# Patient Record
Sex: Female | Born: 1950 | Race: Black or African American | Hispanic: No | Smoking: Current every day smoker
Health system: Southern US, Community
[De-identification: ages and names within clinical notes are randomized; demographics above are authoritative.]

## PROBLEM LIST (undated history)

## (undated) DIAGNOSIS — J45909 Unspecified asthma, uncomplicated: Secondary | ICD-10-CM

## (undated) DIAGNOSIS — I10 Essential (primary) hypertension: Secondary | ICD-10-CM

## (undated) DIAGNOSIS — R011 Cardiac murmur, unspecified: Secondary | ICD-10-CM

## (undated) DIAGNOSIS — J449 Chronic obstructive pulmonary disease, unspecified: Secondary | ICD-10-CM

## (undated) DIAGNOSIS — IMO0001 Reserved for inherently not codable concepts without codable children: Secondary | ICD-10-CM

## (undated) DIAGNOSIS — K219 Gastro-esophageal reflux disease without esophagitis: Secondary | ICD-10-CM

## (undated) DIAGNOSIS — J439 Emphysema, unspecified: Secondary | ICD-10-CM

## (undated) DIAGNOSIS — R51 Headache: Secondary | ICD-10-CM

## (undated) DIAGNOSIS — R519 Headache, unspecified: Secondary | ICD-10-CM

## (undated) DIAGNOSIS — M1990 Unspecified osteoarthritis, unspecified site: Secondary | ICD-10-CM

## (undated) DIAGNOSIS — F32A Depression, unspecified: Secondary | ICD-10-CM

## (undated) DIAGNOSIS — F329 Major depressive disorder, single episode, unspecified: Secondary | ICD-10-CM

## (undated) HISTORY — PX: ROTATOR CUFF REPAIR: SHX139

## (undated) HISTORY — PX: ABDOMINAL HYSTERECTOMY: SHX81

## (undated) HISTORY — PX: BACK SURGERY: SHX140

## (undated) HISTORY — PX: KNEE SURGERY: SHX244

---

## 1997-03-14 ENCOUNTER — Encounter: Payer: Self-pay | Admitting: Pulmonary Disease

## 1997-11-15 ENCOUNTER — Other Ambulatory Visit: Admission: RE | Admit: 1997-11-15 | Discharge: 1997-11-15 | Payer: Self-pay | Admitting: Obstetrics & Gynecology

## 1998-01-29 ENCOUNTER — Ambulatory Visit (HOSPITAL_COMMUNITY): Admission: RE | Admit: 1998-01-29 | Discharge: 1998-01-29 | Payer: Self-pay | Admitting: *Deleted

## 1998-11-18 ENCOUNTER — Other Ambulatory Visit: Admission: RE | Admit: 1998-11-18 | Discharge: 1998-11-18 | Payer: Self-pay | Admitting: Obstetrics & Gynecology

## 1998-12-31 ENCOUNTER — Encounter: Payer: Self-pay | Admitting: Pulmonary Disease

## 1999-04-30 ENCOUNTER — Ambulatory Visit (HOSPITAL_COMMUNITY): Admission: RE | Admit: 1999-04-30 | Discharge: 1999-04-30 | Payer: Self-pay | Admitting: Endocrinology

## 1999-04-30 ENCOUNTER — Encounter: Payer: Self-pay | Admitting: Endocrinology

## 1999-06-23 ENCOUNTER — Encounter (INDEPENDENT_AMBULATORY_CARE_PROVIDER_SITE_OTHER): Payer: Self-pay

## 1999-06-23 ENCOUNTER — Encounter: Payer: Self-pay | Admitting: *Deleted

## 1999-06-23 ENCOUNTER — Ambulatory Visit (HOSPITAL_COMMUNITY): Admission: RE | Admit: 1999-06-23 | Discharge: 1999-06-23 | Payer: Self-pay | Admitting: *Deleted

## 1999-07-08 ENCOUNTER — Emergency Department (HOSPITAL_COMMUNITY): Admission: EM | Admit: 1999-07-08 | Discharge: 1999-07-08 | Payer: Self-pay | Admitting: Emergency Medicine

## 1999-07-29 ENCOUNTER — Encounter: Payer: Self-pay | Admitting: *Deleted

## 1999-07-29 ENCOUNTER — Ambulatory Visit (HOSPITAL_COMMUNITY): Admission: RE | Admit: 1999-07-29 | Discharge: 1999-07-29 | Payer: Self-pay | Admitting: *Deleted

## 1999-09-24 ENCOUNTER — Encounter: Payer: Self-pay | Admitting: *Deleted

## 1999-09-24 ENCOUNTER — Ambulatory Visit (HOSPITAL_COMMUNITY): Admission: RE | Admit: 1999-09-24 | Discharge: 1999-09-24 | Payer: Self-pay | Admitting: *Deleted

## 1999-11-26 ENCOUNTER — Ambulatory Visit (HOSPITAL_COMMUNITY): Admission: RE | Admit: 1999-11-26 | Discharge: 1999-11-26 | Payer: Self-pay | Admitting: Endocrinology

## 1999-11-26 ENCOUNTER — Encounter: Payer: Self-pay | Admitting: Endocrinology

## 1999-12-07 ENCOUNTER — Other Ambulatory Visit: Admission: RE | Admit: 1999-12-07 | Discharge: 1999-12-07 | Payer: Self-pay | Admitting: Obstetrics & Gynecology

## 2000-02-29 ENCOUNTER — Encounter: Admission: RE | Admit: 2000-02-29 | Discharge: 2000-03-28 | Payer: Self-pay | Admitting: Endocrinology

## 2000-05-04 ENCOUNTER — Encounter: Admission: RE | Admit: 2000-05-04 | Discharge: 2000-05-04 | Payer: Self-pay | Admitting: Endocrinology

## 2000-05-04 ENCOUNTER — Encounter: Payer: Self-pay | Admitting: Endocrinology

## 2001-05-12 ENCOUNTER — Encounter (INDEPENDENT_AMBULATORY_CARE_PROVIDER_SITE_OTHER): Payer: Self-pay | Admitting: Specialist

## 2001-05-12 ENCOUNTER — Ambulatory Visit (HOSPITAL_COMMUNITY): Admission: RE | Admit: 2001-05-12 | Discharge: 2001-05-12 | Payer: Self-pay | Admitting: *Deleted

## 2001-05-29 ENCOUNTER — Other Ambulatory Visit: Admission: RE | Admit: 2001-05-29 | Discharge: 2001-05-29 | Payer: Self-pay | Admitting: Obstetrics & Gynecology

## 2002-06-18 ENCOUNTER — Other Ambulatory Visit: Admission: RE | Admit: 2002-06-18 | Discharge: 2002-06-18 | Payer: Self-pay | Admitting: Obstetrics & Gynecology

## 2002-09-24 ENCOUNTER — Encounter: Payer: Self-pay | Admitting: Orthopedic Surgery

## 2002-09-25 ENCOUNTER — Inpatient Hospital Stay (HOSPITAL_COMMUNITY): Admission: RE | Admit: 2002-09-25 | Discharge: 2002-09-27 | Payer: Self-pay | Admitting: Orthopedic Surgery

## 2002-09-25 ENCOUNTER — Encounter: Payer: Self-pay | Admitting: Orthopedic Surgery

## 2003-01-11 ENCOUNTER — Ambulatory Visit (HOSPITAL_COMMUNITY): Admission: RE | Admit: 2003-01-11 | Discharge: 2003-01-11 | Payer: Self-pay | Admitting: *Deleted

## 2003-01-11 ENCOUNTER — Encounter (INDEPENDENT_AMBULATORY_CARE_PROVIDER_SITE_OTHER): Payer: Self-pay | Admitting: Specialist

## 2003-02-15 ENCOUNTER — Other Ambulatory Visit: Admission: RE | Admit: 2003-02-15 | Discharge: 2003-02-15 | Payer: Self-pay | Admitting: Obstetrics & Gynecology

## 2003-06-24 ENCOUNTER — Other Ambulatory Visit: Admission: RE | Admit: 2003-06-24 | Discharge: 2003-06-24 | Payer: Self-pay | Admitting: Obstetrics & Gynecology

## 2003-12-02 ENCOUNTER — Other Ambulatory Visit: Admission: RE | Admit: 2003-12-02 | Discharge: 2003-12-02 | Payer: Self-pay | Admitting: Obstetrics & Gynecology

## 2004-02-24 ENCOUNTER — Encounter (INDEPENDENT_AMBULATORY_CARE_PROVIDER_SITE_OTHER): Payer: Self-pay | Admitting: Specialist

## 2004-02-24 ENCOUNTER — Ambulatory Visit (HOSPITAL_COMMUNITY): Admission: RE | Admit: 2004-02-24 | Discharge: 2004-02-24 | Payer: Self-pay | Admitting: *Deleted

## 2004-06-24 ENCOUNTER — Other Ambulatory Visit: Admission: RE | Admit: 2004-06-24 | Discharge: 2004-06-24 | Payer: Self-pay | Admitting: Obstetrics & Gynecology

## 2004-09-07 ENCOUNTER — Ambulatory Visit (HOSPITAL_COMMUNITY): Admission: RE | Admit: 2004-09-07 | Discharge: 2004-09-07 | Payer: Self-pay | Admitting: Orthopedic Surgery

## 2004-10-06 ENCOUNTER — Ambulatory Visit (HOSPITAL_COMMUNITY): Admission: RE | Admit: 2004-10-06 | Discharge: 2004-10-07 | Payer: Self-pay | Admitting: Neurosurgery

## 2005-01-04 ENCOUNTER — Ambulatory Visit (HOSPITAL_COMMUNITY): Admission: RE | Admit: 2005-01-04 | Discharge: 2005-01-04 | Payer: Self-pay | Admitting: Neurosurgery

## 2005-01-06 ENCOUNTER — Other Ambulatory Visit: Admission: RE | Admit: 2005-01-06 | Discharge: 2005-01-06 | Payer: Self-pay | Admitting: Obstetrics & Gynecology

## 2005-02-12 ENCOUNTER — Inpatient Hospital Stay (HOSPITAL_COMMUNITY): Admission: RE | Admit: 2005-02-12 | Discharge: 2005-02-14 | Payer: Self-pay | Admitting: Neurosurgery

## 2005-06-30 ENCOUNTER — Other Ambulatory Visit: Admission: RE | Admit: 2005-06-30 | Discharge: 2005-06-30 | Payer: Self-pay | Admitting: Obstetrics & Gynecology

## 2006-12-06 ENCOUNTER — Encounter: Admission: RE | Admit: 2006-12-06 | Discharge: 2006-12-06 | Payer: Self-pay | Admitting: Neurosurgery

## 2006-12-08 ENCOUNTER — Ambulatory Visit (HOSPITAL_COMMUNITY): Admission: RE | Admit: 2006-12-08 | Discharge: 2006-12-08 | Payer: Self-pay | Admitting: *Deleted

## 2007-05-02 ENCOUNTER — Inpatient Hospital Stay (HOSPITAL_COMMUNITY): Admission: RE | Admit: 2007-05-02 | Discharge: 2007-05-05 | Payer: Self-pay | Admitting: Neurosurgery

## 2007-08-10 ENCOUNTER — Encounter: Admission: RE | Admit: 2007-08-10 | Discharge: 2007-08-10 | Payer: Self-pay | Admitting: Orthopedic Surgery

## 2007-10-20 ENCOUNTER — Ambulatory Visit (HOSPITAL_COMMUNITY): Admission: RE | Admit: 2007-10-20 | Discharge: 2007-10-20 | Payer: Self-pay | Admitting: Endocrinology

## 2008-12-24 ENCOUNTER — Inpatient Hospital Stay (HOSPITAL_COMMUNITY): Admission: EM | Admit: 2008-12-24 | Discharge: 2008-12-26 | Payer: Self-pay | Admitting: Interventional Radiology

## 2009-01-29 ENCOUNTER — Encounter: Admission: RE | Admit: 2009-01-29 | Discharge: 2009-01-29 | Payer: Self-pay | Admitting: Endocrinology

## 2009-02-20 ENCOUNTER — Inpatient Hospital Stay (HOSPITAL_COMMUNITY): Admission: EM | Admit: 2009-02-20 | Discharge: 2009-02-27 | Payer: Self-pay | Admitting: Emergency Medicine

## 2009-02-20 ENCOUNTER — Ambulatory Visit: Payer: Self-pay | Admitting: Internal Medicine

## 2009-03-04 ENCOUNTER — Encounter: Payer: Self-pay | Admitting: Pulmonary Disease

## 2009-03-05 ENCOUNTER — Ambulatory Visit: Payer: Self-pay | Admitting: Pulmonary Disease

## 2009-03-05 DIAGNOSIS — J45909 Unspecified asthma, uncomplicated: Secondary | ICD-10-CM | POA: Insufficient documentation

## 2009-03-05 DIAGNOSIS — I4891 Unspecified atrial fibrillation: Secondary | ICD-10-CM | POA: Insufficient documentation

## 2009-03-05 DIAGNOSIS — I10 Essential (primary) hypertension: Secondary | ICD-10-CM | POA: Insufficient documentation

## 2009-03-05 DIAGNOSIS — J4489 Other specified chronic obstructive pulmonary disease: Secondary | ICD-10-CM | POA: Insufficient documentation

## 2009-03-05 DIAGNOSIS — J449 Chronic obstructive pulmonary disease, unspecified: Secondary | ICD-10-CM

## 2009-03-05 DIAGNOSIS — R930 Abnormal findings on diagnostic imaging of skull and head, not elsewhere classified: Secondary | ICD-10-CM | POA: Insufficient documentation

## 2009-03-05 DIAGNOSIS — J438 Other emphysema: Secondary | ICD-10-CM | POA: Insufficient documentation

## 2009-03-18 ENCOUNTER — Ambulatory Visit: Payer: Self-pay | Admitting: Pulmonary Disease

## 2009-06-09 ENCOUNTER — Ambulatory Visit: Payer: Self-pay | Admitting: Internal Medicine

## 2009-07-11 ENCOUNTER — Encounter: Admission: RE | Admit: 2009-07-11 | Discharge: 2009-07-11 | Payer: Self-pay | Admitting: Endocrinology

## 2009-08-08 ENCOUNTER — Encounter: Admission: RE | Admit: 2009-08-08 | Discharge: 2009-08-08 | Payer: Self-pay | Admitting: Obstetrics & Gynecology

## 2010-03-31 ENCOUNTER — Encounter: Admission: RE | Admit: 2010-03-31 | Discharge: 2010-03-31 | Payer: Self-pay | Admitting: Neurosurgery

## 2010-08-27 ENCOUNTER — Encounter
Admission: RE | Admit: 2010-08-27 | Discharge: 2010-08-27 | Payer: Self-pay | Source: Home / Self Care | Attending: Obstetrics & Gynecology | Admitting: Obstetrics & Gynecology

## 2010-09-24 IMAGING — CR DG CHEST 1V PORT
1 series · 1 of 1 positions shown · non-contrast
Comparison: Chest 02/21/2009 at 7055 hours.

CLINICAL DATA: PICC placement.

PORTABLE CHEST - 1 VIEW

[view not recorded]
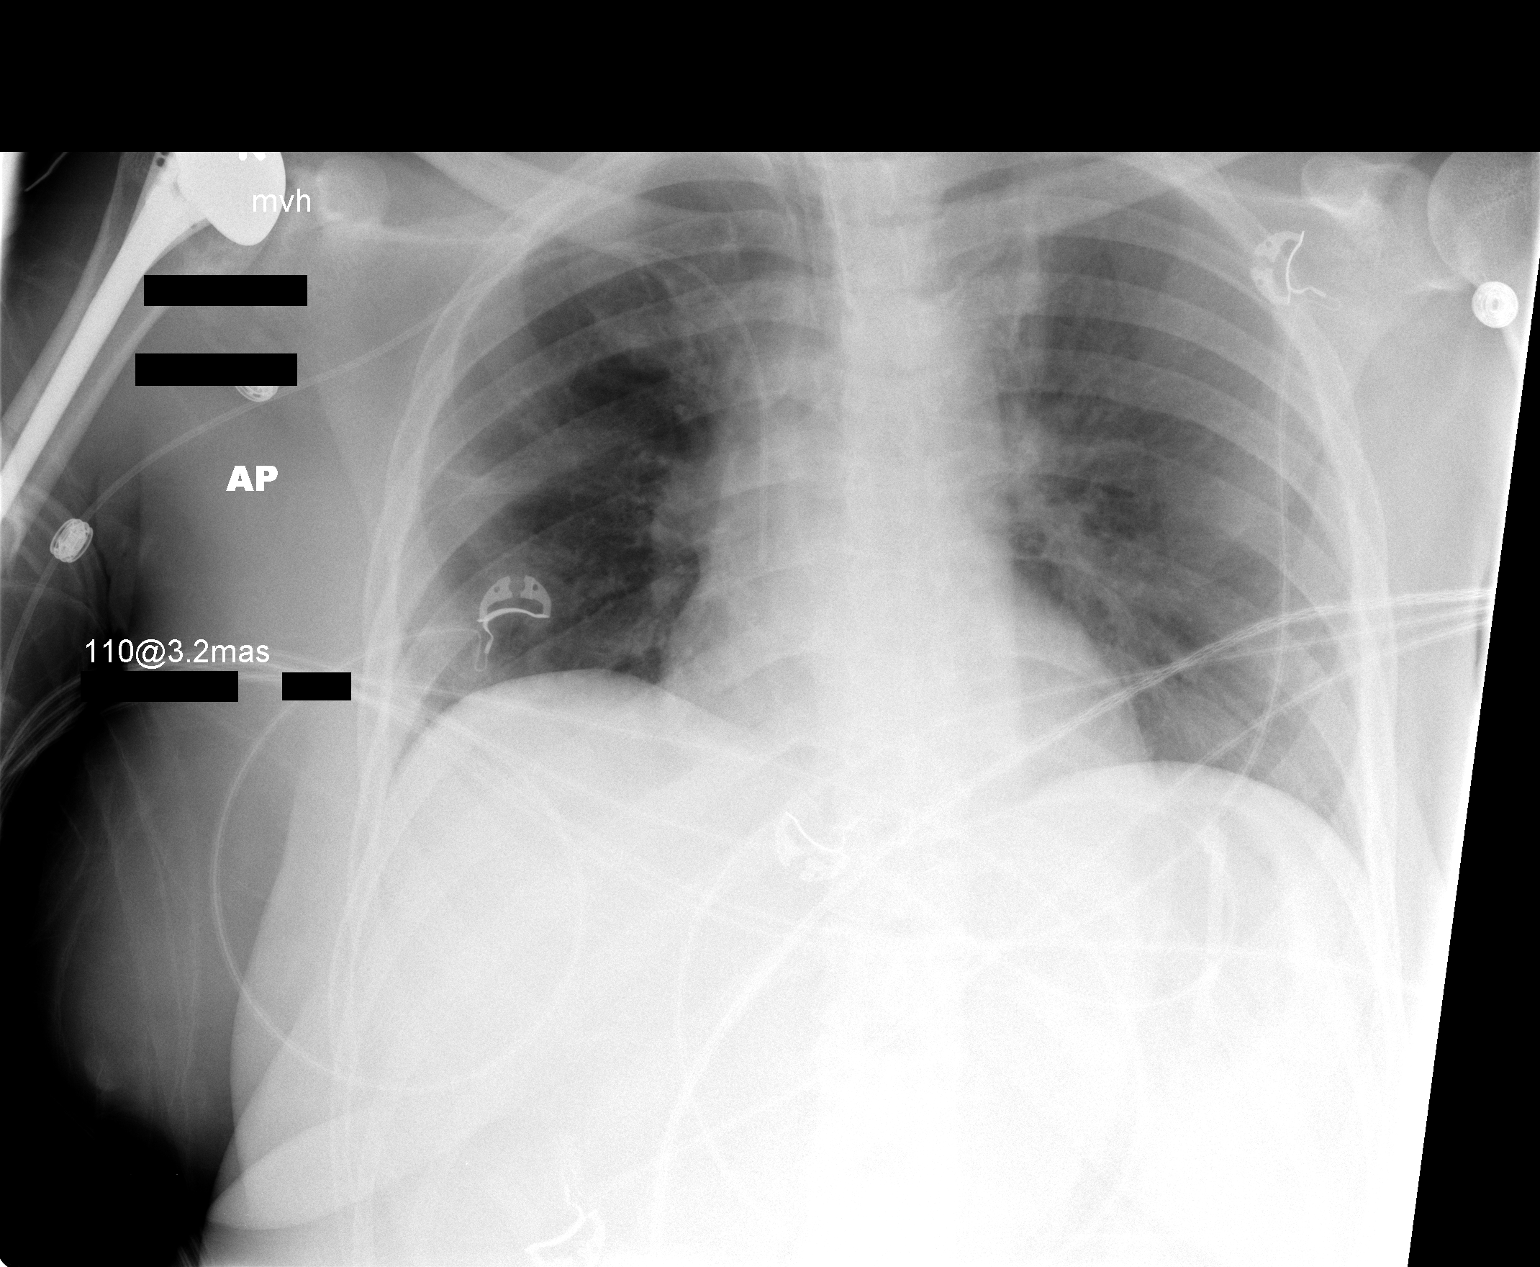

[1 of 1 positions shown; findings below may reference images not displayed]

FINDINGS: Right PICC is in place the tip in the lower superior vena
cava.  Endotracheal tube again noted. No pneumothorax.  Lungs are
clear.  Heart size normal.
IMPRESSION: Right PICC in good position.  No pneumothorax or other change.

## 2010-09-25 IMAGING — CR DG CHEST 1V PORT
1 series · 1 of 1 positions shown · non-contrast
Comparison: 02/21/2009.

CLINICAL DATA: Respiratory distress.

PORTABLE CHEST - 1 VIEW

[view not recorded]
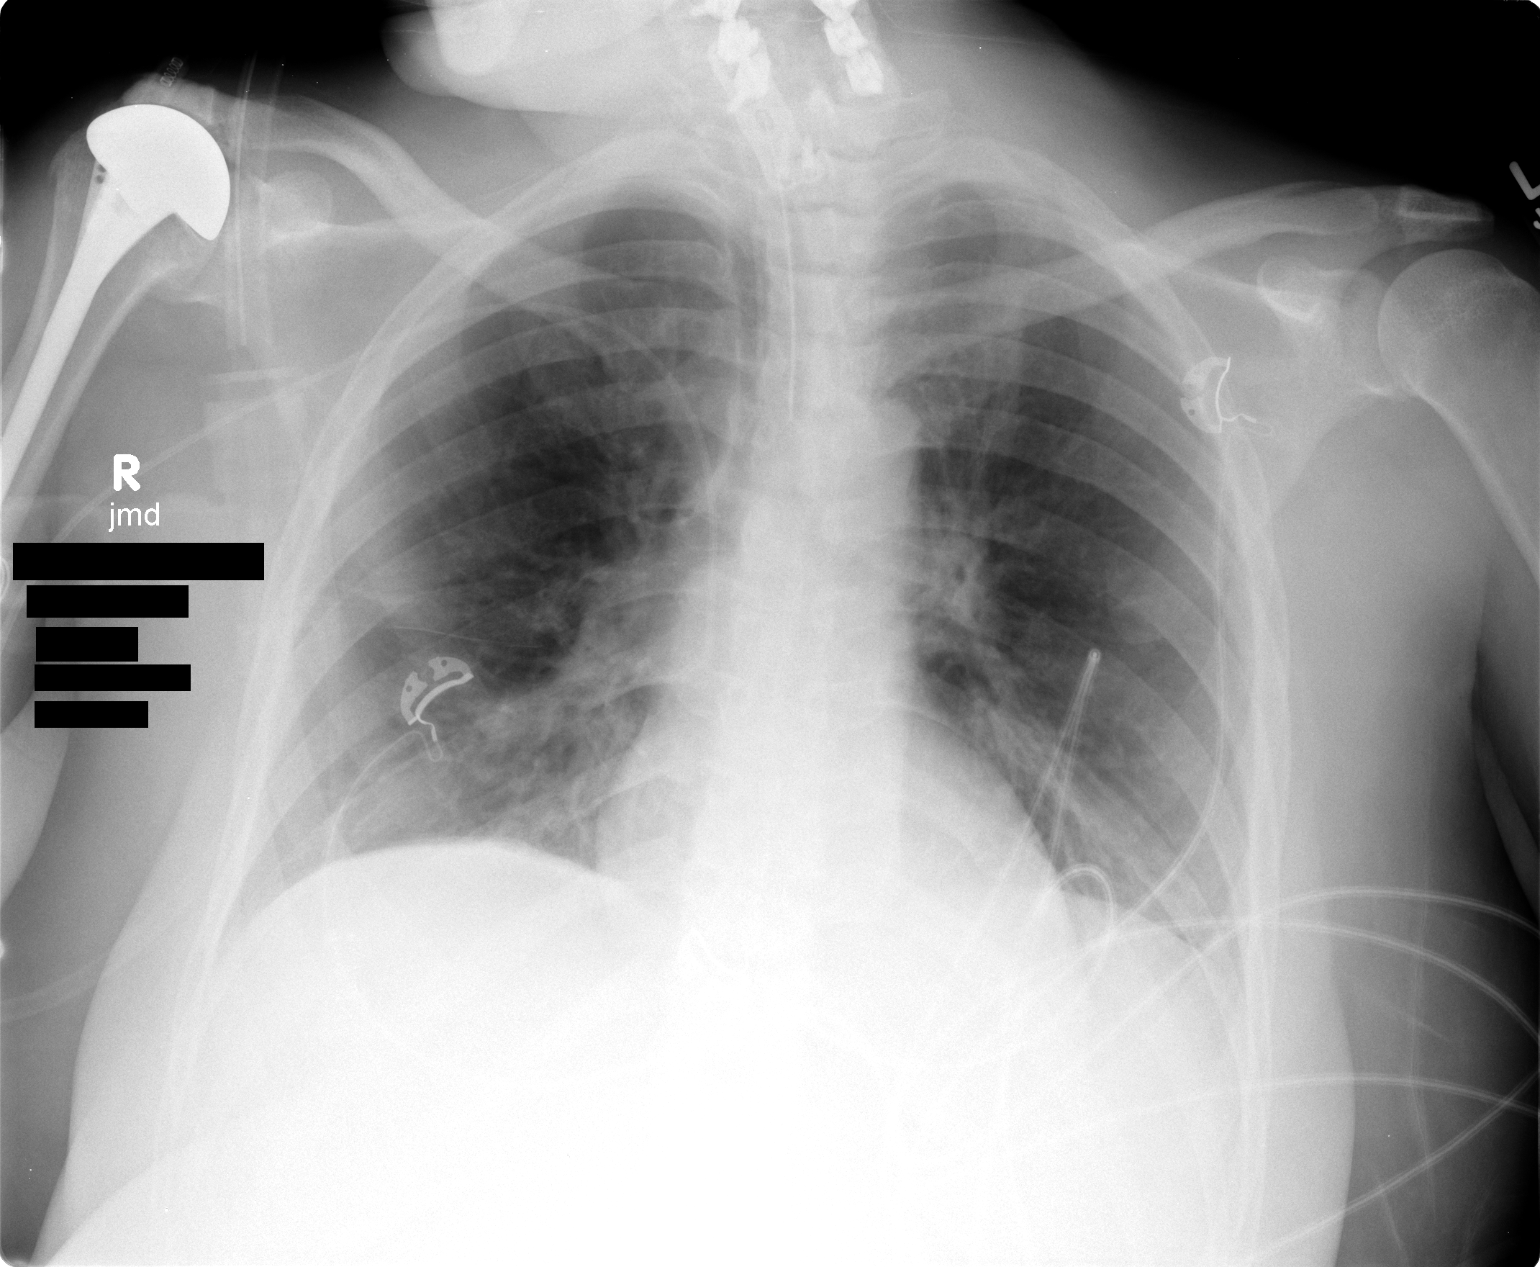

[1 of 1 positions shown; findings below may reference images not displayed]

FINDINGS: Endotracheal tube is in good position.  PICC line tip is
in the SVC and unchanged.  Increase in bibasilar airspace disease
which is likely atelectasis.  There is no edema or effusion.
IMPRESSION: Mild increase in bibasilar atelectasis.

## 2010-09-26 IMAGING — CR DG ABD PORTABLE 1V
1 series · 1 of 1 positions shown · non-contrast
Comparison: None.

CLINICAL DATA: 57-year-old female nasogastric tube placement.

ABDOMEN - 1 VIEW

[view not recorded]
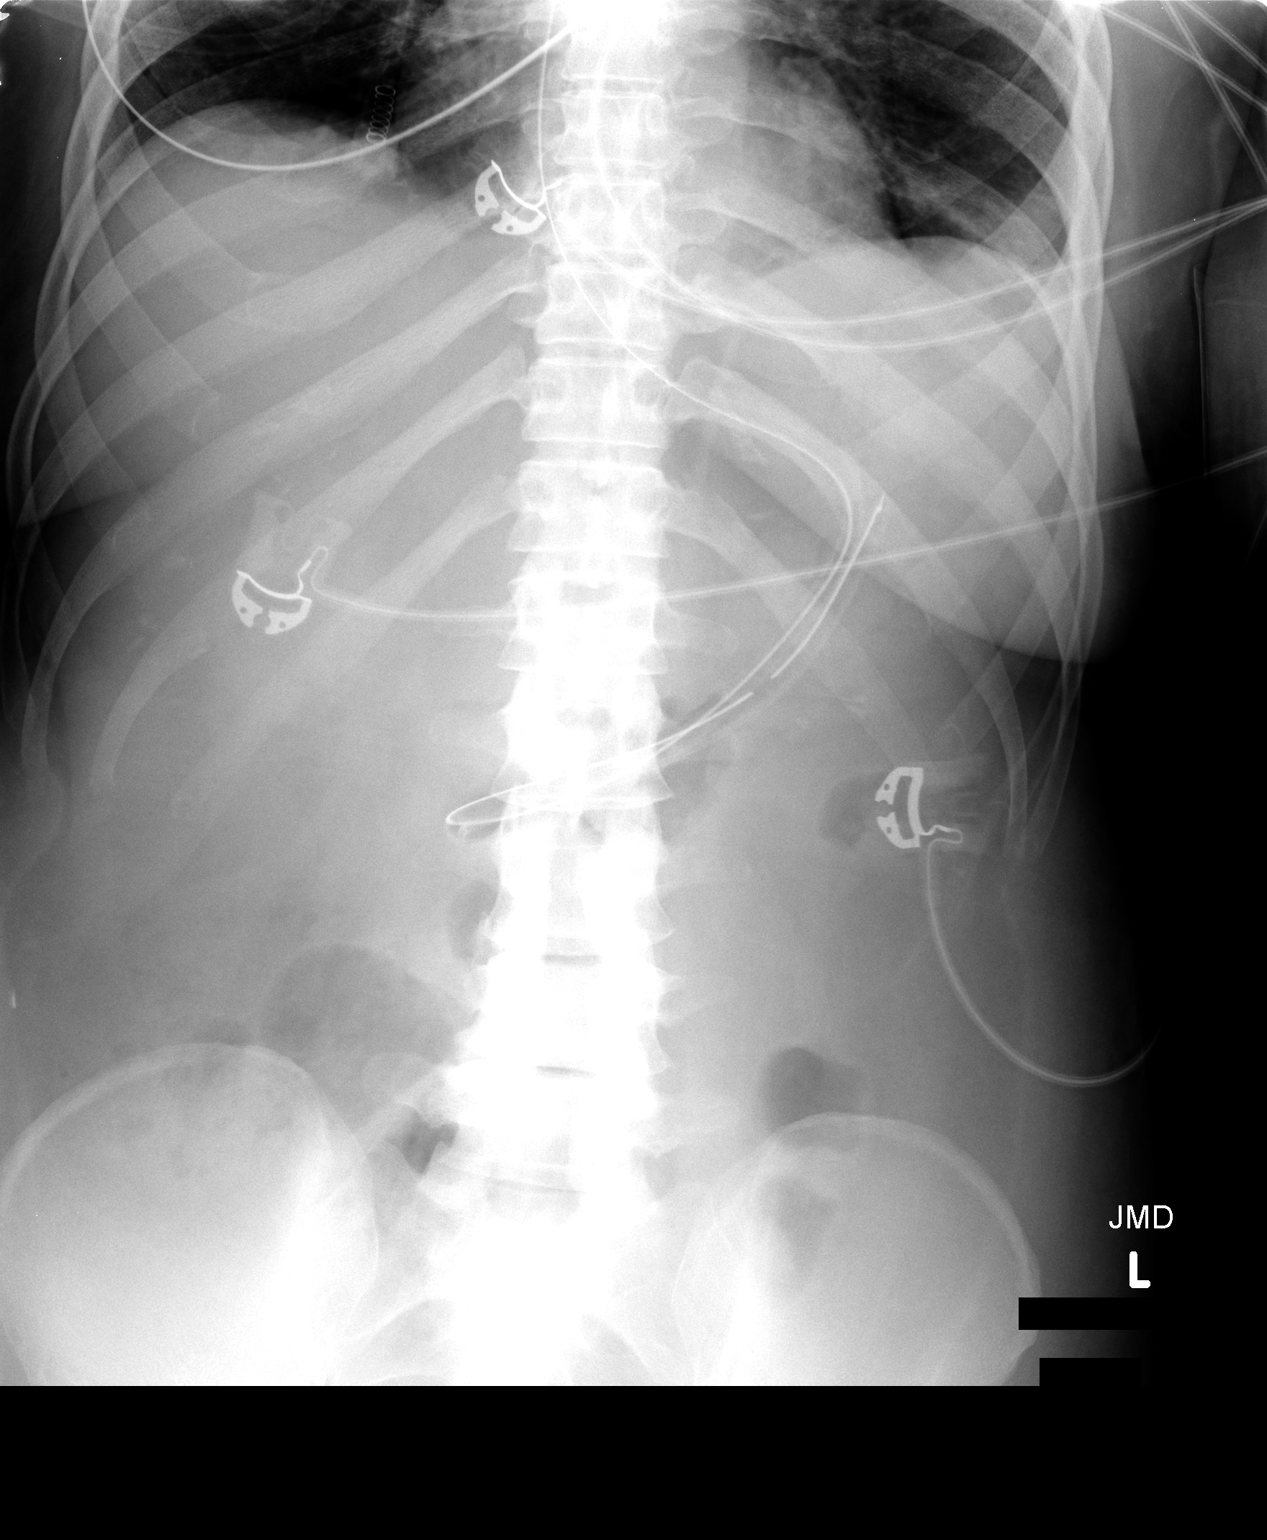

[1 of 1 positions shown; findings below may reference images not displayed]

FINDINGS: Portable AP view 6626 hours.  Nasogastric tube is looped
in the gastric body, tip at the level of fundus.  Paucity of bowel
gas.  Mild basilar atelectasis at the lung bases.
IMPRESSION: Nasogastric type tube looped in the stomach, tip at the level of
the fundus.

## 2010-11-08 LAB — BLOOD GAS, ARTERIAL
Acid-Base Excess: 1.2 mmol/L (ref 0.0–2.0)
Acid-base deficit: 10 mmol/L — ABNORMAL HIGH (ref 0.0–2.0)
Acid-base deficit: 12 mmol/L — ABNORMAL HIGH (ref 0.0–2.0)
Acid-base deficit: 12.8 mmol/L — ABNORMAL HIGH (ref 0.0–2.0)
Bicarbonate: 19.9 mEq/L — ABNORMAL LOW (ref 20.0–24.0)
Bicarbonate: 22.6 mEq/L (ref 20.0–24.0)
Bicarbonate: 25.9 mEq/L — ABNORMAL HIGH (ref 20.0–24.0)
Bicarbonate: 30.4 mEq/L — ABNORMAL HIGH (ref 20.0–24.0)
Drawn by: 232811
Drawn by: 280971
Drawn by: 312761
FIO2: 0.3 %
FIO2: 0.3 %
FIO2: 0.3 %
FIO2: 0.5 %
FIO2: 0.5 %
MECHVT: 0.65 mL
MECHVT: 500 mL
MECHVT: 550 mL
MECHVT: 650 mL
MECHVT: 650 mL
MECHVT: 650 mL
Mode: 650
O2 Saturation: 95.5 %
O2 Saturation: 96.8 %
O2 Saturation: 98 %
O2 Saturation: 98.9 %
PEEP: 5 cmH2O
PEEP: 5 cmH2O
PEEP: 5 cmH2O
PEEP: 5 cmH2O
PEEP: 5 cmH2O
Patient temperature: 97.3
Patient temperature: 98.6
Patient temperature: 98.6
Patient temperature: 98.6
Patient temperature: 98.6
Patient temperature: 98.6
Patient temperature: 98.6
Patient temperature: 98.6
RATE: 10 resp/min
RATE: 10 resp/min
RATE: 10 resp/min
RATE: 12 resp/min
RATE: 8 resp/min
TCO2: 16.7 mmol/L (ref 0–100)
TCO2: 18.5 mmol/L (ref 0–100)
TCO2: 21.4 mmol/L (ref 0–100)
TCO2: 23.6 mmol/L (ref 0–100)
pCO2 arterial: 47.1 mmHg — ABNORMAL HIGH (ref 35.0–45.0)
pCO2 arterial: 53.9 mmHg — ABNORMAL HIGH (ref 35.0–45.0)
pCO2 arterial: 57.7 mmHg (ref 35.0–45.0)
pH, Arterial: 7.187 — CL (ref 7.350–7.400)
pH, Arterial: 7.311 — ABNORMAL LOW (ref 7.350–7.400)
pH, Arterial: 7.388 (ref 7.350–7.400)
pO2, Arterial: 101 mmHg — ABNORMAL HIGH (ref 80.0–100.0)
pO2, Arterial: 119 mmHg — ABNORMAL HIGH (ref 80.0–100.0)

## 2010-11-08 LAB — CROSSMATCH: ABO/RH(D): A POS

## 2010-11-08 LAB — CBC
HCT: 35.3 % — ABNORMAL LOW (ref 36.0–46.0)
HCT: 35.7 % — ABNORMAL LOW (ref 36.0–46.0)
HCT: 37.3 % (ref 36.0–46.0)
HCT: 40.5 % (ref 36.0–46.0)
Hemoglobin: 11.7 g/dL — ABNORMAL LOW (ref 12.0–15.0)
Hemoglobin: 13.7 g/dL (ref 12.0–15.0)
Hemoglobin: 8.6 g/dL — ABNORMAL LOW (ref 12.0–15.0)
MCHC: 32.4 g/dL (ref 30.0–36.0)
MCHC: 32.8 g/dL (ref 30.0–36.0)
MCHC: 33.7 g/dL (ref 30.0–36.0)
MCHC: 33.8 g/dL (ref 30.0–36.0)
MCV: 93.3 fL (ref 78.0–100.0)
MCV: 94.1 fL (ref 78.0–100.0)
MCV: 95 fL (ref 78.0–100.0)
MCV: 95.7 fL (ref 78.0–100.0)
Platelets: 305 10*3/uL (ref 150–400)
RBC: 2.72 MIL/uL — ABNORMAL LOW (ref 3.87–5.11)
RBC: 3.76 MIL/uL — ABNORMAL LOW (ref 3.87–5.11)
RBC: 3.79 MIL/uL — ABNORMAL LOW (ref 3.87–5.11)
RBC: 4 MIL/uL (ref 3.87–5.11)
RBC: 4.38 MIL/uL (ref 3.87–5.11)
RDW: 13.4 % (ref 11.5–15.5)
RDW: 13.7 % (ref 11.5–15.5)
RDW: 13.9 % (ref 11.5–15.5)
RDW: 13.9 % (ref 11.5–15.5)
WBC: 10.2 10*3/uL (ref 4.0–10.5)
WBC: 10.5 10*3/uL (ref 4.0–10.5)
WBC: 8.9 10*3/uL (ref 4.0–10.5)
WBC: 9.8 10*3/uL (ref 4.0–10.5)

## 2010-11-08 LAB — RAPID URINE DRUG SCREEN, HOSP PERFORMED
Amphetamines: NOT DETECTED
Barbiturates: NOT DETECTED
Benzodiazepines: POSITIVE — AB

## 2010-11-08 LAB — BASIC METABOLIC PANEL
BUN: 12 mg/dL (ref 6–23)
BUN: 13 mg/dL (ref 6–23)
CO2: 17 mEq/L — ABNORMAL LOW (ref 19–32)
CO2: 17 mEq/L — ABNORMAL LOW (ref 19–32)
CO2: 26 mEq/L (ref 19–32)
CO2: 29 mEq/L (ref 19–32)
Calcium: 7 mg/dL — ABNORMAL LOW (ref 8.4–10.5)
Calcium: 7.5 mg/dL — ABNORMAL LOW (ref 8.4–10.5)
Calcium: 8.1 mg/dL — ABNORMAL LOW (ref 8.4–10.5)
Calcium: 8.8 mg/dL (ref 8.4–10.5)
Chloride: 102 mEq/L (ref 96–112)
Chloride: 105 mEq/L (ref 96–112)
Chloride: 108 mEq/L (ref 96–112)
Chloride: 117 mEq/L — ABNORMAL HIGH (ref 96–112)
Creatinine, Ser: 0.78 mg/dL (ref 0.4–1.2)
Creatinine, Ser: 0.79 mg/dL (ref 0.4–1.2)
Creatinine, Ser: 1.36 mg/dL — ABNORMAL HIGH (ref 0.4–1.2)
GFR calc Af Amer: 40 mL/min — ABNORMAL LOW (ref >60)
GFR calc Af Amer: 49 mL/min — ABNORMAL LOW (ref >60)
GFR calc Af Amer: >60 mL/min (ref >60)
GFR calc Af Amer: >60 mL/min (ref >60)
GFR calc Af Amer: >60 mL/min (ref >60)
GFR calc Af Amer: >60 mL/min (ref >60)
GFR calc Af Amer: >60 mL/min (ref >60)
GFR calc non Af Amer: 51 mL/min — ABNORMAL LOW (ref >60)
GFR calc non Af Amer: >60 mL/min (ref >60)
Glucose, Bld: 120 mg/dL — ABNORMAL HIGH (ref 70–99)
Glucose, Bld: 145 mg/dL — ABNORMAL HIGH (ref 70–99)
Glucose, Bld: 217 mg/dL — ABNORMAL HIGH (ref 70–99)
Potassium: 3 mEq/L — ABNORMAL LOW (ref 3.5–5.1)
Potassium: 3.2 mEq/L — ABNORMAL LOW (ref 3.5–5.1)
Potassium: 3.4 mEq/L — ABNORMAL LOW (ref 3.5–5.1)
Potassium: 3.8 mEq/L (ref 3.5–5.1)
Sodium: 135 mEq/L (ref 135–145)
Sodium: 139 mEq/L (ref 135–145)
Sodium: 140 mEq/L (ref 135–145)

## 2010-11-08 LAB — BRAIN NATRIURETIC PEPTIDE: Pro B Natriuretic peptide (BNP): 93.7 pg/mL (ref 0.0–100.0)

## 2010-11-08 LAB — PROTIME-INR
INR: 1.1 (ref 0.00–1.49)
Prothrombin Time: 14.6 seconds (ref 11.6–15.2)

## 2010-11-08 LAB — GLUCOSE, CAPILLARY
Glucose-Capillary: 115 mg/dL — ABNORMAL HIGH (ref 70–99)
Glucose-Capillary: 140 mg/dL — ABNORMAL HIGH (ref 70–99)
Glucose-Capillary: 143 mg/dL — ABNORMAL HIGH (ref 70–99)
Glucose-Capillary: 144 mg/dL — ABNORMAL HIGH (ref 70–99)
Glucose-Capillary: 146 mg/dL — ABNORMAL HIGH (ref 70–99)
Glucose-Capillary: 148 mg/dL — ABNORMAL HIGH (ref 70–99)
Glucose-Capillary: 148 mg/dL — ABNORMAL HIGH (ref 70–99)
Glucose-Capillary: 151 mg/dL — ABNORMAL HIGH (ref 70–99)
Glucose-Capillary: 156 mg/dL — ABNORMAL HIGH (ref 70–99)
Glucose-Capillary: 169 mg/dL — ABNORMAL HIGH (ref 70–99)
Glucose-Capillary: 200 mg/dL — ABNORMAL HIGH (ref 70–99)
Glucose-Capillary: 75 mg/dL (ref 70–99)
Glucose-Capillary: 97 mg/dL (ref 70–99)

## 2010-11-08 LAB — CULTURE, BLOOD (ROUTINE X 2)
Culture: NO GROWTH
Culture: NO GROWTH

## 2010-11-08 LAB — URINALYSIS, ROUTINE W REFLEX MICROSCOPIC
Bilirubin Urine: NEGATIVE
Glucose, UA: NEGATIVE mg/dL
Protein, ur: 100 mg/dL — AB
Urobilinogen, UA: 0.2 mg/dL (ref 0.0–1.0)

## 2010-11-08 LAB — URINE MICROSCOPIC-ADD ON

## 2010-11-08 LAB — CULTURE, RESPIRATORY W GRAM STAIN

## 2010-11-08 LAB — CARDIAC PANEL(CRET KIN+CKTOT+MB+TROPI)
CK, MB: 3.6 ng/mL (ref 0.3–4.0)
CK, MB: 5.2 ng/mL — ABNORMAL HIGH (ref 0.3–4.0)
Relative Index: 2.1 (ref 0.0–2.5)
Troponin I: 0.2 ng/mL — ABNORMAL HIGH (ref 0.00–0.06)

## 2010-11-08 LAB — LACTIC ACID, PLASMA
Lactic Acid, Venous: 1.7 mmol/L (ref 0.5–2.2)
Lactic Acid, Venous: 4.4 mmol/L — ABNORMAL HIGH (ref 0.5–2.2)

## 2010-11-08 LAB — HEMOGLOBIN: Hemoglobin: 7.6 g/dL — CL (ref 12.0–15.0)

## 2010-11-08 LAB — URINE CULTURE
Colony Count: 50000
Special Requests: NEGATIVE

## 2010-11-08 LAB — MAGNESIUM: Magnesium: 2.1 mg/dL (ref 1.5–2.5)

## 2010-11-08 LAB — LEGIONELLA ANTIGEN, URINE

## 2010-11-10 LAB — BASIC METABOLIC PANEL
BUN: 39 mg/dL — ABNORMAL HIGH (ref 6–23)
CO2: 24 mEq/L (ref 19–32)
Calcium: 8.8 mg/dL (ref 8.4–10.5)
Creatinine, Ser: 1.32 mg/dL — ABNORMAL HIGH (ref 0.4–1.2)
GFR calc non Af Amer: 42 mL/min — ABNORMAL LOW (ref >60)
GFR calc non Af Amer: 45 mL/min — ABNORMAL LOW (ref >60)
GFR calc non Af Amer: 53 mL/min — ABNORMAL LOW (ref >60)
Glucose, Bld: 100 mg/dL — ABNORMAL HIGH (ref 70–99)
Potassium: 4.4 mEq/L (ref 3.5–5.1)
Potassium: 4.9 mEq/L (ref 3.5–5.1)
Sodium: 132 mEq/L — ABNORMAL LOW (ref 135–145)
Sodium: 133 mEq/L — ABNORMAL LOW (ref 135–145)
Sodium: 134 mEq/L — ABNORMAL LOW (ref 135–145)

## 2010-11-10 LAB — DIFFERENTIAL
Basophils Absolute: 0.1 10*3/uL (ref 0.0–0.1)
Basophils Relative: 0 % (ref 0–1)
Lymphocytes Relative: 19 % (ref 12–46)
Monocytes Absolute: 1 10*3/uL (ref 0.1–1.0)
Neutro Abs: 11.6 10*3/uL — ABNORMAL HIGH (ref 1.7–7.7)
Neutrophils Relative %: 74 % (ref 43–77)

## 2010-11-10 LAB — CBC
HCT: 27.9 % — ABNORMAL LOW (ref 36.0–46.0)
HCT: 30.3 % — ABNORMAL LOW (ref 36.0–46.0)
Hemoglobin: 10 g/dL — ABNORMAL LOW (ref 12.0–15.0)
Hemoglobin: 11.9 g/dL — ABNORMAL LOW (ref 12.0–15.0)
Hemoglobin: 9.6 g/dL — ABNORMAL LOW (ref 12.0–15.0)
MCHC: 33.1 g/dL (ref 30.0–36.0)
Platelets: 231 10*3/uL (ref 150–400)
Platelets: 307 10*3/uL (ref 150–400)
RDW: 17 % — ABNORMAL HIGH (ref 11.5–15.5)
WBC: 16.3 10*3/uL — ABNORMAL HIGH (ref 4.0–10.5)
WBC: 6.4 10*3/uL (ref 4.0–10.5)

## 2010-11-10 LAB — TROPONIN I: Troponin I: 0.16 ng/mL — ABNORMAL HIGH (ref 0.00–0.06)

## 2010-11-10 LAB — CULTURE, BLOOD (ROUTINE X 2)
Culture: NO GROWTH
Culture: NO GROWTH

## 2010-11-10 LAB — CARDIAC PANEL(CRET KIN+CKTOT+MB+TROPI)
CK, MB: 1.4 ng/mL (ref 0.3–4.0)
CK, MB: 2.1 ng/mL (ref 0.3–4.0)
Total CK: 30 U/L (ref 7–177)
Total CK: 43 U/L (ref 7–177)

## 2010-11-10 LAB — BRAIN NATRIURETIC PEPTIDE: Pro B Natriuretic peptide (BNP): <30 pg/mL (ref 0.0–100.0)

## 2010-12-15 NOTE — Op Note (Signed)
Colleen [name], Colleen [name]                ACCOUNT NO.:  [id number]   MEDICAL RECORD NO.:  [id number]          PATIENT TYPE:  INP   LOCATION:  3172                         FACILITY:  [facility]   PHYSICIAN:  Hilda Lias, M.D.   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   PROCEDURE:  Bilateral L3, L4, and L5 laminectomy, foraminotomy,  posterolateral arthrodesis with autograft.   SURGEON:  Hilda Lias, M.D.   ASSISTANT:  Payton Doughty, M.D.   CLINICAL HISTORY:  The patient was admitted because of back pain with  radiation to both legs, especially with walking.  X-rays showed severe  stenosis at the level of L3-4, L4-5, and L5-S1.  Surgery was advised.   PROCEDURE:  The patient was taken to the O.R.,and she was positioned in  a prone manner.  The skin was cleaned with DuraPrep.  Drapes were  applied.  Midline incision from L3 down to L5-S1 was made, and muscle  was retracted laterally until we were able to see the lateral aspect of  the facet.  Then, with the Leksell, we removed the spinous processes of  L3, L4, and L5.  This bone was really quite strong.  With the drill, we  did a bilateral laminectomy.  A thick yellow ligament, mostly at the  level of L3-4 and L4-5, was removed.  From then on, having a good  laminotomy and a good decompression, we went laterally, and we were able  to do laminotomy to decompress the L3, L4, L5, and S1 nerve roots.  Then, with the drill, we removed the periosteum of the lateral aspect of  facet as well as the transverse processes of L3, L4, and L5, and the ala  of the sacrum.  The bone that came from the spine was cleaned, and it  was used laterally for posterolateral arthrodesis.  The wound was  irrigated.  Fentanyl was left. Valsalva maneuver was negative.  The skin  was closed with Vicryl and Steri-Strips.           ______________________________  Hilda Lias, M.D.     EB/MEDQ  D:  05/02/2007  T:  05/02/2007  Job:  [id number]

## 2010-12-15 NOTE — Discharge Summary (Signed)
[name], Colleen [name]                ACCOUNT NO.:  [id number]   MEDICAL RECORD NO.:  [id number]          PATIENT TYPE:  INP   LOCATION:  3022                         FACILITY:  [facility]   PHYSICIAN:  Hilda Lias, M.D.   DATE OF BIRTH:  07/22/1951   DATE OF ADMISSION:  05/02/2007  DATE OF DISCHARGE:  05/05/2007                               DISCHARGE SUMMARY   ADMISSION DIAGNOSIS:  Lumbar stenosis L3 to L5-S1.   FINAL DIAGNOSIS:  Lumbar stenosis L3 to L5-S1.   CLINICAL HISTORY:  The patient was admitted because of back pain  radiating to both legs.  X-ray showed severe stenosis from 3-4, 4-5.  Surgery was advised.   LABORATORY DATA:  Normal.   COURSE IN THE HOSPITAL:  The patient was taken to surgery, and a  laminectomy and posterolateral arthrodesis with autograft were done.  Today she is feeling much better.  She is ambulating.  She had minimal  pain.  She is being discharged to be followed by me in my office.   CONDITION ON DISCHARGE:  Improvement.   MEDICATIONS:  Percocet, diazepam.  She is to continue her previous  medications.   DIET:  Regular.   ACTIVITY:  Not to drive for at least two weeks, not to bend.   FOLLOWUP:  She will be seen by me in six weeks.           ______________________________  Hilda Lias, M.D.     EB/MEDQ  D:  05/05/2007  T:  05/06/2007  Job:  [id number]

## 2010-12-15 NOTE — H&P (Signed)
[name], [name] NO.:  [id number]   MEDICAL RECORD NO.:  [id number]          PATIENT TYPE:  EMS   LOCATION:  ED                           FACILITY:  [facility]   PHYSICIAN:  Zannie Cove, MD     DATE OF BIRTH:  May 24, 1951   DATE OF ADMISSION:  12/24/2008  DATE OF DISCHARGE:                              HISTORY & PHYSICAL   CHIEF COMPLAINT:  Shortness of breath and wheezing.   HISTORY OF PRESENTING ILLNESS:  Colleen [name] is a 60 year old African  American female, current smoker, who presents with complaints of  shortness of breath and cough for the past 2-3 days.  She has had  worsening shortness of breath associated with wheezing and nonproductive  cough that did not improve with albuterol treatments at home.  It  worsened to the extent of feeling severe shortness of breath and chest  tightness at rest as well.  Patient reports feeling feverish and also  reports chills, denies chest pain.  However, she has chest tightness.  She denies any sick contacts except sitting next to a sick child about 2  weeks ago, history of rhinorrhea present that seems to be chronic, no  weight loss.  Patient is a current smoker, smokes 1 pack per day.  In  addition, she also uses oxygen as needed.   PAST MEDICAL HISTORY:  1. Significant for asthma/COPD.  2. Hypertension.  3. Spinal stenosis.  4. Allergies.  5. Cluster headaches.  6. Heart murmur.   PAST SURGICAL HISTORY:  Hysterectomy, bilateral knee surgery,  cervical/lower back spine surgery.   ALLERGIES:  CODEINE AND LITHIUM.   MEDICATIONS:  1. Symbicort 160/4.5 two puffs b.i.d.  2. Propranolol 20 mg b.i.d.  3. Divalproex 500 at bedtime.  4. Nabumetone 500 p.r.n.  5. Triamterene/hydrochlorothiazide 37.5/25 daily.  6. Hydrocodone/APAP 7.5/750 q.i.d.  7. Migranal 4 mg p.o. p.r.n.  8. __________ 5 mg daily.  9. Crestor 20 mg daily.  10.Synthroid 50 mcg daily.  11.Effexor 75 mg daily.  12.Hyoscyamine 0.125 mg p.r.n.  13.Protonix 40 mg daily.  14.Lumigan 0.03% in both eyes at bedtime.  15.Oxygen as needed.  16.Benicar 20 mg daily.  17.Albuterol inhalation p.r.n.  18.EpiPen subcutaneously p.r.n.  19.Metrocream 0.75% b.i.d.   SOCIAL HISTORY:  Lives at home by herself, former heavy smoker, smoked 2-  3 packs per day, currently smokes 1 pack per day and has been an active  smoker for 40 years.  History of alcohol also, drinks 2 glasses of wine  per night, denies drug abuse.   FAMILY HISTORY:  Sister is deceased, had asthma.   REVIEW OF SYSTEMS:  Twelve-system review negative except for HPI.   PHYSICAL EXAMINATION:  VITAL SIGNS:  Temperature 98.7, pulse 120, blood  pressure 135/71, respirations 24, saturating 99% on 4 L.  GENERAL:  Alert, awake, oriented x3, moderate distress, not using  accessory muscles.  HEENT/NECK:  No JVD.  CARDIOVASCULAR:  S1, S2, regular rate and rhythm.  LUNGS:  Moderate air entry bilaterally and bilateral inspiratory and  expiratory wheezes.  ABDOMEN:  Soft, nontender, positive bowel sounds.  EXTREMITIES:  No edema, clubbing or cyanosis.   LABORATORIES:  White count of 15.6, hemoglobin 11.9, platelets 307,  sodium 132, potassium 4.1, chloride 99, bicarb 24, BUN 51, creatinine  1.3, glucose 100, BNP less than 30, D-dimer less than 0.22.  Chest x-ray  with right basilar atelectasis, no acute cardiopulmonary disease.  EKG  with sinus tachycardia, rate 119, no acute ST-T changes.   ASSESSMENT/PLAN:  1. This is a 60 year old female with chronic obstructive pulmonary      disease exacerbation.  We will admit to the floor and treat with      supplemental oxygen to keep saturations greater than 90%.  In      addition, give Xopenex and Atrovent nebulizers q.4 h.and p.r.n. and      IV Solu-Medrol 80 mg IV q.8 h.  Will also check two sets of cardiac      enzymes and blood and sputum cultures.  In addition, patient to      receive pneumonia vaccine on discharge and smoking  cessation      counseling as well.  2. We will hold the propranolol which she seems to be on for      hypertension.  3. Renal insufficiency likely secondary to dehydration.  Hold Benicar      and hydrochlorothiazide for now.  Hydrate with normal saline on 75      mL an hour.  4. Hypertension.  Monitor off propranolol, hydrochlorothiazide and      Benicar for now.  Continue triamterene.  Reintroduce angiotensin      receptor blocker and hydrochlorothiazide as renal function      improves.  5. Hypothyroidism.  Continue Synthroid.  6. Deep venous thrombosis prophylaxis with Lovenox 40 mg      subcutaneously daily.  7. The patient is a full code.      Zannie Cove, MD  Electronically Signed     PJ/MEDQ  D:  12/24/2008  T:  12/24/2008  Job:  [id number]

## 2010-12-15 NOTE — H&P (Signed)
Colleen [name], Colleen [name]                ACCOUNT NO.:  [id number]   MEDICAL RECORD NO.:  [id number]          PATIENT TYPE:  INP   LOCATION:  1417                         FACILITY:  [facility]   PHYSICIAN:  Theodosia Paling, MD    DATE OF BIRTH:  01/03/1951   DATE OF ADMISSION:  12/24/2008  DATE OF DISCHARGE:  12/26/2008                              HISTORY & PHYSICAL   PRIMARY CARE PHYSICIAN:  Brooke Bonito, M.D.   ADMISSION HISTORY:  Please refer to Dr. Terrace Arabia admitting  history.   DISCHARGE DIAGNOSES:  1. Acute chronic obstructive pulmonary disease exacerbation.  2. Hypertension.  3. Sinus tachycardia.  4. Acute renal insufficiency.   SECONDARY DIAGNOSES:  1. History of spinal stenosis.  2. History of cluster headache.   DISCHARGE MEDICATIONS:  1. New medication added Norvasc 10 mg daily.  2. Prednisone 50 mg p.o. daily for 3 days; then 40 mg p.o. daily for 3      days; then 30 mg p.o. daily for 3 days; then 20 mg p.o. daily for 3      days; then 10 mg p.o. daily for 3 days; then 5 mg p.o. daily for 3      days.   MEDICATIONS STOPPED:  1. The patient is instructed to stop HCTZ.  2. Benicar.  3. Triamterine.  4. Percocet.  5. Nabumetone.   DISCHARGE MEDICATIONS:  The patient is instructed to continue home  medication which includes the following:  1. Symbicort 160/4.5 two puffs q.12 h.  2. Propranolol 20 mg p.o. q.12 h.  3. Divalproex 500 mg p.o. q.h.s.  4. Migranal 4 mg p.o. daily p.r.n.  5. Diazepam 5 mg p.o. daily.  6. Crestor 20 mg p.o. daily.  7. Synthroid 0.05 mg p.o. daily.  8. Effexor 75 mg p.o. daily.  9. Hyoscyamine 0.125 mg p.o. daily p.r.n.  10.Protonix 40 mg p.o. daily.  11.Lumigan 0.03% eye drops at bedtime.  12.Oxygen nasal cannula as needed.  13.Epipen p.r.n.  14.Albuterol 2.5 mg nebulizers q.6 h. p.r.n.  15.Metrocream 0.75% q.12 h.   HOSPITAL COURSE:  1. Acute chronic obstructive pulmonary disease exacerbation.  The      patient was started on  IV steroids, nebulizers, Spiriva was      continued.  The patient at the time of discharge, respiratory      status is good.  She is satting 98% on room air at rest as well.      She is able to ambulate without discomfort and is in no respiratory      distress.  She will be discharged on tapering dose of prednisone      and can continue to take her home medications for COPD which is      Symbicort and Spiriva.  2. Tachycardia.  Initially propranolol was stopped.  She developed      reflex tachycardia from the effect of stopping propranolol in      addition to albuterol.  She remained asymptomatic at this time.      Her heart rate has come down to 84.  She can  resume her home dose      of propranolol on discharge.  3. Hypertension.  On admission, she has renal insufficiency.      Therefore, initial antihypertensives were stopped.  Norvasc 10 mg      can be added at this time.  Her blood pressure can be followed as      an outpatient.  4. Acute renal insufficiency.  The patient came with mild acute renal      insufficiency most likely from ACE inhibitor and diuretics which      were held.  Gentle hydration was given.  Her creatinine came down      to normal.  At the time of discharge is 1.23.  She will follow up      with her primary care physician within 4-5 days' time with a      followup with the basic metabolic panel will be done.   DISPOSITION:  The patient is going home.   CONSULTATIONS:  None.   PROCEDURES PERFORMED:  None.   DIAGNOSTICS:  Chest x-ray on Dec 24, 2008 showing there is right basilar  atelectasis, otherwise no acute cardiopulmonary process.   FOLLOW UP:  She will follow with Dr. Juleen China in 1 week's time for basic  metabolic panel evaluation and hypertension management.   Total time spent in discharge of this patient around 1 hour.      Theodosia Paling, MD  Electronically Signed     NP/MEDQ  D:  12/26/2008  T:  12/26/2008  Job:  [id number]   cc:   Brooke Bonito, M.D.  Fax: [phone]

## 2010-12-15 NOTE — Discharge Summary (Signed)
Colleen [name], Colleen [name]                ACCOUNT NO.:  [id number]   MEDICAL RECORD NO.:  [id number]          PATIENT TYPE:  INP   LOCATION:  1302                         FACILITY:  [facility]   PHYSICIAN:  Hillery Aldo, M.D.   DATE OF BIRTH:  10/21/1950   DATE OF ADMISSION:  02/20/2009  DATE OF DISCHARGE:  02/27/2009                               DISCHARGE SUMMARY   PRIMARY CARE PHYSICIAN:  Brooke Bonito, M.D.   DISCHARGE DIAGNOSES:  1. Ventilatory dependent respiratory failure.  2. Acute chronic obstructive pulmonary disease exacerbation.  3. Hypertension.  4. Sinus tachycardia.  5. Acute renal failure.  6. Polysubstance abuse.  7. Metabolic encephalopathy secondary to respiratory failure.  8. Herpes simplex virus I.  9. Sinusitis.  10.Hypothyroidism.   DISCHARGE MEDICATIONS:  1. Nabumetone 500 mg p.o. b.i.d.  2. Synthroid 50 mcg p.o. daily.  3. Effexor XR 75 mg 3 capsules p.o. daily.  4. Propranolol 20 mg p.o. b.i.d.  5. Cyclobenzaprine 5 mg p.o. q.12 h. p.r.n. muscle spasm.  6. Omeprazole 20 mg p.o. daily.  7. Benicar 40 mg p.o. daily.  8. Spiriva 1 inhalation daily.  9. Prednisone 40 mg x3 days; 30 mg x3 days; 20 mg x3 days; 10 mg x3      days; then stop.  10.Levaquin 750 mg p.o. daily through March 06, 2009.  11.Hydrochlorothiazide 25 mg p.o. daily.  12.Albuterol 2.5 mg HFA 2 puffs q.4 h. p.r.n. dyspnea.   CONSULTATIONS:  Charlcie Cradle. Delford Field, MD, of Pulmonary Critical Care  Medicine.   BRIEF ADMISSION HISTORY OF PRESENT ILLNESS:  The patient is a 60-year-  old female who presented to the hospital with progressive dyspnea.  She  essentially called EMS due to respiratory distress and upon arrival,  they found her unresponsive with minimal air movement.  She subsequently  was intubated and transferred to the hospital for further evaluation and  treatment.  She was extubated successfully on February 25, 2009 and  subsequently transferred to the [facility].  For the full  details, please see the patient's chart.   PROCEDURES/DIAGNOSTIC STUDIES:  1. CT scan of the head on February 20, 2009 showed no acute intracranial      abnormality.  Acute and chronic sinusitis.  2. Chest x-rays obtained on February 20, 2009 x2, February 21, 2009 x2, February 22, 2009, February 23, 2009, February 24, 2009, February 25, 2009 and February 26, 2009.  Most recent chest x-ray showed improved bibasilar aeration      with mild residual atelectasis.  3. Portable abdominal film on February 23, 2009 showed nasogastric tube      looped in the stomach, tip at the level of the fundus.   LABORATORY DATA:  Discharge laboratory values:  Blood cultures were  negative x5 days.  Chemistries were unremarkable except for a low  potassium at 3.0 which was repleted prior to discharge.  White blood  cell count was 14, hemoglobin 13.7, hematocrit 40.5, platelets 242.  Urine culture grew 50,000 colonies per mL of enterococcus.  Respiratory  cultures  grew  nonpathogenic oropharyngeal type flora.   HOSPITAL COURSE:  1. Respiratory failure secondary to COPD exacerbation, status post      VDRF:  The patient was admitted by the Pulmonary Critical Care      Service and monitored in the ICU on ventilator support.  She was      successfully extubated on February 25, 2009.  Her respiratory status      has remained stable subsequent to this.  At this time, her steroids      have been put over to p.o. and she is close to her baseline status.      She is requesting discharge.  Given that she is no longer acute, we      will discharge her home with followup.  2. Hypokalemia:  The patient has been put on potassium      supplementation.  3. Hypertension:  The patient had suboptimal control, but has not been      on her home regimen.  We will discharge her on her home regimen      with the addition of hydrochlorothiazide.  4. HSV I:  The patient was put on Zovirax for suppressive therapy.  5. Sinusitis:  The patient will complete 2  weeks of therapy with      antibiotics.  She is currently on Levaquin and will be continued on      this through March 06, 2009.  6. Polysubstance abuse:  The patient did have an urine drug screen      done on admission which was positive for benzodiazepines, cocaine      and opiates.  We will have the social worker provide her with an      ADS referral prior to discharge.  7. Acute renal failure:  The patient's renal failure was likely      secondary to early sepsis in the setting of respiratory failure.      She was hydrated and her kidney function is at baseline values.   DISPOSITION:  The patient is medically stable and wishes to be  discharged home.  She still has some residual bronchospasm on exam, but  is complaining of no dyspnea and is taking p.o.'s adequately.  Given  this, I will discharge her home with instructions to follow up with Dr.  Juleen China in 1 week's time.  She will be on a slow steroid taper and  antibiotics as described above.  She is instructed to come back to the  hospital if she experiences any worsening of symptoms.   Time spent coordinating care for discharge and discharge instructions  equals 35 minutes.      Hillery Aldo, M.D.  Electronically Signed     CR/MEDQ  D:  02/27/2009  T:  02/27/2009  Job:  [id number]   cc:   Brooke Bonito, M.D.  Fax: [phone]

## 2010-12-15 NOTE — H&P (Signed)
Colleen [name], Colleen [name]                ACCOUNT NO.:  [id number]   MEDICAL RECORD NO.:  [id number]          PATIENT TYPE:  INP   LOCATION:  2899                         FACILITY:  [facility]   PHYSICIAN:  Hilda Lias, M.D.   DATE OF BIRTH:  May 10, 1951   DATE OF ADMISSION:  05/02/2007  DATE OF DISCHARGE:                              HISTORY & PHYSICAL   Colleen [name] is a lady who I have been following for many years.  She in  the past had a fusion anteriorly in the cervical area at the level 5-6,  6-7.  Lately she had been complaining of back pain with radiation to  both legs, mostly with walking, up to the point that on several  occasions she had fallen on the floor.  She denies any problem with  bladder or bowel.  She had failed with conservative treatment.  X-ray  shows severe stenosis from L3 down to L5-S1.  Because of the findings,  she is being admitted for surgery.   PAST MEDICAL HISTORY:  Anterior cervical fusion.  She also has had  shoulder surgery and both knees.   FAMILY HISTORY:  Unremarkable.   REVIEW OF SYSTEMS:  Positive for back pain, leg pain, weakness, and  shoulder pain.   PHYSICAL EXAMINATION:  HEAD, EYES, EARS, NOSE AND THROAT:  Normal.  NECK:  There is a scar anterior on the neck.  She has some pain with  flexibility.  LUNGS:  Clear.  HEART SOUNDS:  Normal.  ABDOMEN:  Normal.  EXTREMITIES:  Normal pulses.  NEUROLOGIC:  She complains of weakness.  She has some weakness with  dorsiflexion of both feet, about 4/5.  Femoral stretch maneuver is  positive bilaterally.  Straight leg raising is positive bilaterally  about 60 degrees.   X-rays showed that she has a severe stenosis from L3 down to L5.   CLINICAL IMPRESSION:  Lumbar stenosis with neurogenic claudication.   RECOMMENDATIONS:  The patient is being admitted for surgery.  The  procedure will be bilateral 3-4-5 laminectomy and foraminotomy, with  possible in situ infusion with autograft.  She knows about the  risks  such as infection, CSF leak, worsening of the pain, paralysis, and need  of further surgery.           ______________________________  Hilda Lias, M.D.     EB/MEDQ  D:  05/02/2007  T:  05/02/2007  Job:  [id number]

## 2010-12-18 NOTE — Op Note (Signed)
NAMEKRYSTALYN, Colleen [name]                          ACCOUNT NO.:  [id number]   MEDICAL RECORD NO.:  [id number]                   PATIENT TYPE:  AMB   LOCATION:  ENDO                                 FACILITY:  [facility]   PHYSICIAN:  Georgiana Spinner, M.D.                 DATE OF BIRTH:  1951-05-02   DATE OF PROCEDURE:  02/24/2004  DATE OF DISCHARGE:                                 OPERATIVE REPORT   PROCEDURE:  Upper endoscopy with Savary dilation.   INDICATIONS:  Dysphagia.   ANESTHESIA:  Demerol 50 mg, Versed 6 mg.   PROCEDURE:  With the patient mildly sedated in the left lateral decubitus  position, the Olympus videoscopic endoscope was inserted in the mouth,  passed under direct vision through the esophagus, which appeared normal.  We  entered into the stomach.  Fundus, body appeared normal.  Antrum showed some  erythematous changes, which were photographed and biopsied.  The duodenal  bulb and second portion of the duodenum were visualized.  From this point  the endoscope was slowly withdrawn, taking circumferential views of the  duodenal mucosa until the endoscope had been pulled back in the stomach,  placed in retroflexion to view the stomach from below.  The endoscope was  straightened and a guidewire was passed.  The endoscope was withdrawn.  Subsequently Savary dilators 14 and 16 were passed rather easily with the  latter.  The guidewire was removed, the endoscope was reinserted into the  stomach, which appeared normal.  The endoscope was withdrawn, taking  circumferential views of the remaining gastric and esophageal mucosa.  The  patient's vital signs and pulse oximetry remained stable.  The patient  tolerated the procedure well without apparent complication.   FINDINGS:  1. Antral erythema, biopsied.  2. Dilation of distal esophagus to 14 and 16 Savary dilation.   PLAN:  Await clinical response.  The patient will call me for results and  follow up with me as an  outpatient.                                               Georgiana Spinner, M.D.    GMO/MEDQ  D:  02/24/2004  T:  02/24/2004  Job:  [id number]

## 2010-12-18 NOTE — Procedures (Signed)
[place]. [facility]  Patient:    Colleen [name], Colleen [name]                         MRN: [id number] Proc. Date: 09/24/99 Adm. Date:  [id number] Attending:  Sabino Gasser                           Procedure Report  PROCEDURE:  Upper endoscopy with Savary dilation.  ENDOSCOPIST:  Sabino Gasser, M.D.  INDICATIONS:  Dysphagia.  ANESTHESIA:  Fentanyl 100 mcg, Versed 10 mg, droperidol 5 mg were given all intravenously in divided dose.  PROCEDURE:  With patient mildly sedated in the left lateral decubitus position, the Olympus videoscopic endoscope was inserted in the mouth and passed under direct  vision through the esophagus into the stomach, which appeared normal.  Duodenal  bulb was well viewed and appeared normal as well.  From this point, the endoscope was slowly withdrawn, taking circumferential views of the duodenal mucosa until the endoscope had been pulled back into the stomach, placed on retroflexion to view the stomach from below and this appeared normal and was photographed.  The endoscope was straightened and pulled back from distal to proximal stomach taking circumferential views of the entire gastric and subsequently esophageal mucosa.  The endoscope was then readvanced and a guidewire was passed.  Endoscope was withdrawn, subsequently a 14 and 17 Savary dilators were passed and the guidewire was removed with the latter.  The endoscope was reinserted, no bleeding was seen. Mucosa appeared intact.  Patients vital signs and pulse oximeter remained stable. The patient tolerated the procedure well without apparent complications.  FINDINGS:  Dilation of esophagus 14 and 17 Savary.  PLAN:  Liquids for today, solid food starting tomorrow and have patient follow p with me upon her return from [place], where she is going for [employer]. DD:  09/24/99 TD:  09/24/99 Job: [id number]

## 2010-12-18 NOTE — Discharge Summary (Signed)
NAMEKEAUNDRA, STEHLE                ACCOUNT NO.:  [id number]   MEDICAL RECORD NO.:  [id number]          PATIENT TYPE:  INP   LOCATION:  3012                         FACILITY:  [facility]   PHYSICIAN:  Stefani Dama, M.D.  DATE OF BIRTH:  04-14-1951   DATE OF ADMISSION:  02/12/2005  DATE OF DISCHARGE:  02/14/2005                                 DISCHARGE SUMMARY   ADMISSION DIAGNOSIS:  Pseudoarthrosis of cervical spine, C5 through C7.   DISCHARGE DIAGNOSIS:  Pseudoarthrosis of cervical spine, C5 through C7.   OPERATION:  Posterior supplemental arthrodesis, C5 through C7, with  segmental instrumentation and local autograft, allograft, and bone growth  stimulant.   CONDITION ON DISCHARGE:  Improving.   HOSPITAL COURSE:  Ms. Naraya Veldhuizen is a 60 year old individual who has a  pseudoarthrosis of the cervical spine. She was advised regarding  supplemental arthrodesis via posterior approach using bone morphogenic  protein on his infuse in addition to segmental fixation. This was performed  on Friday, July 14th. During the first postoperative day she noted an  exquisitely sensitive lumbar radiculopathy involving the right upper  extremity particularly. This seems to have calmed down. Her incision is  clean and dry. At the current time she is being sent home with a  prescription for Percocet #60 without refills, Valium 5 mg #40 without  refills. She has been on Decadron 4 mg q.6h. since the time of her surgery.  This was abruptly stopped today and she will be maintained on a Medrol  Dosepak for a period of six days to wean her away from the steroid  medication. Beyond that the patient has been using some Neurontin for her  pain control and will continue on this medication during the post discharge  period.   CONDITION ON DISCHARGE:  Improving.       HJE/MEDQ  D:  02/14/2005  T:  02/14/2005  Job:  [id number]

## 2010-12-18 NOTE — Op Note (Signed)
[name], Colleen [name]                ACCOUNT NO.:  [id number]   MEDICAL RECORD NO.:  [id number]          PATIENT TYPE:  OIB   LOCATION:  2899                         FACILITY:  [facility]   PHYSICIAN:  Hilda Lias, M.D.   DATE OF BIRTH:  May 21, 1951   DATE OF PROCEDURE:  10/06/2004  DATE OF DISCHARGE:                                 OPERATIVE REPORT   PREOPERATIVE DIAGNOSES:  1.  C5-C6, C6-C7 stenosis with spondylosis.  2.  Cervical myelopathy.  3.  Cervical radiculopathy.   POSTOPERATIVE DIAGNOSES:  1.  C5-C6, C6-C7 stenosis with spondylosis.  2.  Cervical myelopathy.  3.  Cervical radiculopathy.   PROCEDURE:  1.  Anterior 5-6, 6-7 diskectomy.  2.  Decompression of spinal cord.  3.  Bilateral foraminotomy.  4.  Interbody fusion with allograft iliac crest.  5.  Plaque from C5 to C7.  6.  Microscopy.   SURGEON:  Hilda Lias, M.D.   ASSISTANT:  Maeola Harman, M.D.   CLINICAL HISTORY:  The patient is admitted because of neck pain with  radiation to both lower extremities.  She has hyperreflexia.  X-ray shows  severe stenosis at the level of 5-6, 6-7.  Surgery was advised and the risk  was explained in the history and physical.   PROCEDURE:  The patient was taken to the OR and the neck was prepped with  Betadine.  A transverse incision was made through the skin, platysma down to  cervical spine.  Because she has shoulder replacement, it was difficult to  see the area but at the end we had the needle at the level of C4-5 and one  at 5-6.  From then on we opened the anterior ligament at 5-6 and 6-7.  Patient has anterior osteophytes which also were removed.  We entered the  disk space and, indeed, we had to drill it and then using a marking curet  were able to remove degenerative disk disease at both levels.  With drill,  we drilled the body of 5-6 and we went laterally after we opened the  posterior ligament to decompress the C6 nerve root.  The same procedure was  done at the  level of C6 C7.  The bone was really petrotic with poor  bleeding.  The consistency was really hard.  Then we inserted two pieces of  bone graft of 10 mm, iliac crest, which was drilled down to 7.  Then we  applied a plate.  It was difficult to drill into the bone to introduce the  screws up to the point of 5 screws, we were able to deem safely to insert  only 4.  Nevertheless, the plate was secured  in place.  After that __________ again, hemostasis was done with bipolar.  We waited 5 minutes and there was no bleeding.  Lateral C-spine showed good  position of the graft on the plate.  From there on, the wound was closed  with Vicryl and Steri-Strips.      EB/MEDQ  D:  10/06/2004  T:  10/06/2004  Job:  [id number]

## 2010-12-18 NOTE — Op Note (Signed)
   [name], Colleen [name]                          ACCOUNT NO.:  [id number]   MEDICAL RECORD NO.:  [id number]                   PATIENT TYPE:  AMB   LOCATION:  ENDO                                 [facility]ACILITY[facility]   PHYSICIAN:  Georgiana Spinner, M.D.                 DATE OF BIRTH:  07/20/1951   DATE OF PROCEDURE:  01/11/2003  DATE OF DISCHARGE:                                 OPERATIVE REPORT   PROCEDURE:  Colonoscopy.   INDICATIONS:  Colon polyps.   ANESTHESIA:  1. Demerol 70 mg.  2. Versed 7 mg.   DESCRIPTION OF PROCEDURE:  With the patient mildly sedated, in the left  lateral decubitus position, the Olympus videoscopic colonoscope was inserted  into the rectum and passed under direct vision to the cecum, identified by  the ileocecal valve and appendiceal orifice, both of which were  photographed.  From this point, the colonoscope was slowly withdrawn, taking  circumferential views of the entire colonic mucosa, stopping 30 cm from the  anal verge at which point a small polyp was seen, photographed, and removed  using both snare cautery technique and hot biopsy forceps technique.  All of  the tissue was retrieved for pathology.  The endoscope was then withdrawn  all the way to the rectum which appeared normal on direct and showed  hemorrhoids on retroflexed view.  The endoscope was straightened and  withdrawn.  The patient's vital signs and pulse oximeter remained stable.  The patient tolerated the procedure well without apparent complications.   FINDINGS:  1. Small polyp at 30 cm.  2. Internal hemorrhoids  3. Otherwise, an unremarkable colonoscopic examination to the cecum.   PLAN:  Have patient call me for results of biopsy and follow up with me as  an outpatient.                                               Georgiana Spinner, M.D.    GMO/MEDQ  D:  01/11/2003  T:  01/11/2003  Job:  [id number]

## 2010-12-18 NOTE — Op Note (Signed)
NAMEPAGE, PUCCIARELLI                          ACCOUNT NO.:  [id number]   MEDICAL RECORD NO.:  [id number]                   PATIENT TYPE:  INP   LOCATION:  2887                                 FACILITY:  [facility]   PHYSICIAN:  Burnard Bunting, M.D.                 DATE OF BIRTH:  May 14, 1951   DATE OF PROCEDURE:  DATE OF DISCHARGE:                                 OPERATIVE REPORT   PREOPERATIVE DIAGNOSIS:  Right shoulder rotator cuff arthropathy.   POSTOPERATIVE DIAGNOSIS:  Right shoulder rotator cuff arthropathy.   PROCEDURE:  1. Right shoulder cuff tear arthropathy, hemiarthroplasty.  2. Biceps tenodesis.   SURGEON:  Burnard Bunting, M.D.   ANESTHESIA:  General endotracheal plus preoperative inter-Scalene block.   ESTIMATED BLOOD LOSS:  150 mL.   DRAINS:  None.   DESCRIPTION OF PROCEDURE:  The patient is brought to the operating room  where general endotracheal anesthesia was induced.  Preoperative IV  antibiotics were administered.  The right arm and hand were prepped with  DuraPrep solution and draped in a sterile manner.  Collier Flowers was used to cover  the operative field.  An incision was made beginning slightly superior to  the coracoid process, and extending down.  The skin and subcutaneous tissue  were sharply divided.  The deltopectoral interval was identified.  The  cephalic vein was mobilized and retracted laterally with the deltoid.  The  upper 25% of the pectoralis tendon was released.  The circumflex vessels  were identified and ligated.  The rotator interval was then identified.  The  patient had a massive rotator cuff tear involving the supraspinatus and  infraspinatus with retraction to medial to the glenoid rim.  The  subscapularis was removed off the bone sharply.  Four #2 fiber wire modified  Mason-Allen sutures were placed.  The capsule and subscapula were released  back to the inferior humeral neck.  The axillary nerve was palpated before  this maneuver and  protected at all times with a right angle retractor.  The  head was then dislocated anteriorly.  The starting point for the canal  reamer was about 1 cm posterior to the biceps tendon insert, bicipital  groove.  The biceps tendon itself was frayed and was released off of the  glenoid.  The canal was reamed up to a 6 and 8 mm.  Good cortical contact  was achieved with the 8 mm reamer.  The humeral head was then cut in 30  degrees of retroversion with the attached guide.  The humeral head was sized  to an 18 mm x 44 mm.  The canal was then broached and excellent press-fit  was achieved.  With the broach in position, the greater tuberosity was  resected and beveled.  The 18 mm x 44 mm and a 23 mm x 44 mm head were  placed.  The 18 mm x 44  mm gave excellent range of motion with ability to  place the hand onto the belly, the head up on top of the hand.  She had  excellent internal and external rotation.  There was also about 50% inferior  ________ with this prosthesis in position.  At this time the prosthesis was  removed.  The humeral head bone graft was attained.  Drill holes were placed  through the lesser tuberosity.  The biceps tenodesis was performed by  preparing the bicipital groove with rasping, and then suturing the biceps  itself into the bicipital groove.  At this time the 8 mm stem was placed.  The 44 mm x 18 mm head was then tapped into a more stable position.  A good  press-fit was achieved.  The arm was again taken through a range of motion  and found to be stable, with very good range of motion.  At this time the  sutures for the rotator cuff were tied to the anterior humeral rim.  The  incision was thoroughly irrigated with pulsatile lavage.  With the  subscapular repair in place, the patient was found to have good stability in  anterior and posterior stress.  The deltopectoral interval was closed using  #0 Vicryl suture.  The skin and subcutaneous tissue were closed using   interrupted inverted #3-0 Vicryl suture, followed by a running #3-0 Prolene.  An impervious dressing was placed.  The patient tolerated the procedure well without immediate complications.                                                 Burnard Bunting, M.D.    GSD/MEDQ  D:  09/25/2002  T:  09/25/2002  Job:  [id number]

## 2010-12-18 NOTE — H&P (Signed)
Colleen [name], Colleen [name] NO.:  [id number]   MEDICAL RECORD NO.:  [id number]          PATIENT TYPE:  INP   LOCATION:  3172                         FACILITY:  [facility]   PHYSICIAN:  Hilda Lias, M.D.   DATE OF BIRTH:  1951-01-14   DATE OF ADMISSION:  02/12/2005  DATE OF DISCHARGE:                                HISTORY & PHYSICAL   HISTORY:  Colleen [name] is a lady who is being admitted to the hospital for a  full decompression of the cervical spine.  Several months ago she underwent  a decompression at the level of C5-6 and C6-7.  At that time we found  calcification of the posterior ligament with quite a bit of stenosis.  The  patient continued to have neck pain, mostly with pain going to the upper  extremity.  We completed the x-rays which showed still some stenosis  posterior with arthropathy.  Because of the finding, we decided to go ahead  with a posterior decompression using lateral mass screws, as well as VMP.   PAST MEDICAL/SURGICAL HISTORY:  1.  An anterior cervical diskectomy at the level of C5-6 and C6-7.  2.  She has a history of __________.   MEDICATIONS:  1.  She is taking Effexor.  2.  Cardizem.  3.  Topamax.  4.  Hydrochlorothiazide.   FAMILY HISTORY:  Unremarkable.   REVIEW OF SYSTEMS:  Positive for high blood pressure, cough and arthritis.   SOCIAL HISTORY:  She drinks and she smokes one pack a day.   PHYSICAL EXAMINATION:  HEENT:  __________.  NECK:  She has a scar anteriorly.  She is able to flex, but extension  produces the pain going to the shoulder.  LUNGS:  There is some mild rhonchi bilaterally.  HEART:  Sounds normal.  ABDOMEN:  Normal.  EXTREMITIES:  Calf previous surgery.  NEUROLOGIC:  Mental status normal.  Cranial nerves normal.  Strength 5/5 in  the upper and lower extremities, except for some weakness of the deltoid and  biceps.  Sensation:  She has no ___________ in both hands.  Coordination and  gait normal.   Cervical  spine x-rays show stenosis with spondylosis at the level of C5-6  and C6-7.   PLAN:  The patient is being admitted for a decompression posteriorly.  She  knows about the risks such as infection, CSF leak, worsening of the pain,  paralysis, no improvement whatsoever, or further surgery.       EB/MEDQ  D:  02/12/2005  T:  02/12/2005  Job:  [id number]

## 2010-12-18 NOTE — Op Note (Signed)
NAMEVINETTE, CRITES NO.:  [id number]   MEDICAL RECORD NO.:  [id number]          PATIENT TYPE:  AMB   LOCATION:  ENDO                         FACILITY:  [facility]   PHYSICIAN:  Georgiana Spinner, M.D.    DATE OF BIRTH:  1951-02-22   DATE OF PROCEDURE:  12/08/2006  DATE OF DISCHARGE:                               OPERATIVE REPORT   PROCEDURE:  Upper endoscopy with Savary dilation.   INDICATIONS:  Dysphagia with esophageal stricture.   ANESTHESIA:  Fentanyl 100 mcg and Versed 10 mg.   PROCEDURE IN DETAIL:  With the patient minimally sedated in the left  lateral decubitus position, the Pentax videoscopic endoscope was  inserted and passed under direct vision through the esophagus, which  appeared normal until we reached the distal esophagus and there were  changes of an esophageal stricture which was photographed.  We entered  into the stomach.  The fundus, body, antrum, duodenal bulb and the,  second portion of duodenum were visualized.  From this point, the  endoscope was slowly withdrawn, taking circumferential views of duodenal  mucosa until the endoscope had been pulled back into the stomach and  placed in retroflexion to view the stomach from below.  The endoscope  was straightened and a guidewire was passed.  The endoscope was  withdrawn.  Subsequent Savary dilators 16- and 17-mm were passed rather  easily.  With the latter, the guidewire was removed.  The endoscope was  inserted.  Some blood was seen in the stomach from the area of the  stricture.  The endoscope was withdrawn.  The patient's vital signs and  pulse oximeter remained stable.  The patient tolerated the procedure  well without apparent complication.   FINDINGS:  Esophageal stricture, dilated to 16- and 17-millimeters.   PLAN:  Await clinical response.           ______________________________  Georgiana Spinner, M.D.     GMO/MEDQ  D:  12/08/2006  T:  12/08/2006  Job:  [id number]

## 2010-12-18 NOTE — Procedures (Signed)
[facility]  Patient:    Colleen [name], Colleen [name] Visit Number: [id number] MRN: [id number]          Service Type: END Location: ENDO Attending Physician:  Sabino Gasser Proc. Date: 05/12/01 Admit Date:  05/12/2001                             Procedure Report  PROCEDURE:  Upper endoscopy with biopsy and dilation.  INDICATIONS:  Dysphagia.  ANESTHESIA:  Demerol 100 mg, Versed 9 mg.  DESCRIPTION OF PROCEDURE:  With the patient mildly sedated in the left lateral decubitus position, the Olympus videoscopic endoscope was inserted into the mouth and passed under direct vision through the esophagus, which appeared normal, into the stomach.  Fundus, body, antrum, duodenal bulb were all viewed.  From this point, the endoscope was slowly withdrawn, taking circumferential views of the duodenal mucosa until the endoscope then pulled back into the stomach and placed in retroflexion to view the stomach from below.  The endoscope was then straightened and withdrawn, taking circumferential views of the gastric mucosa, stopping in the fundus where a thickened fold was seen.  There may have been an erosion on it, and it was photographed and biopsied.  Once accomplished, the endoscope was then reinserted distally, and a guidewire was passed.  The endoscope was withdrawn, taking circumferential views of the remaining gastric and esophageal mucosa. Over the guidewire, subsequently 15 and 18 Savary dilators were passed easily. With the latter, the guidewire was removed.  The endoscope was reinserted, and we viewed once again the esophagus and stomach which otherwise appeared normal.  The endoscope was withdrawn, taking circumferential views of the gastric and esophageal mucosa.  The patients vital signs and pulse oximeter remained stable.  The patient tolerated the procedure well without apparent complications.  FINDINGS:  Thickened gastric fold, biopsied.  Savary dilation 15 and  18.  PLAN:  We will observe the patients clinical response and have patient follow up with for results of biopsy and as an outpatient. Attending Physician:  Sabino Gasser DD:  05/12/01 TD:  05/12/01 Job: [id number]

## 2010-12-18 NOTE — H&P (Signed)
NAMEERIE, SICA NO.:  [id number]   MEDICAL RECORD NO.:  [id number]                   PATIENT TYPE:  INP   LOCATION:                                       FACILITY:  [facility]   PHYSICIAN:  Reynolds Bowl, M.D.                 DATE OF BIRTH:  June 02, 1951   DATE OF ADMISSION:  09/25/2002  DATE OF DISCHARGE:                                HISTORY & PHYSICAL   HISTORY OF PRESENT ILLNESS:  [name] is a 60 year old right handed  lady with pain and weakness in the right shoulder following a recent fall.   She was seen here in '96 and related that one month prior to that she had  fallen and had discomfort with her shoulders.  Sometimes had a feeling of a  pop with abduction.  She was placed on an exercise program and apparently  did well.   She returned in July of '98 stating that two months prior to that she had  fallen in the shower and felt like maybe her shoulder had popped out, she  pushed it back with her left hand and had a feeling of relief.  At that time  being two months post-injury she was treated with antiinflammatories and  physical therapy.   She returned here in 1/04 and was having intermittent shoulder pain.  Had  slipped on ice at work and was unable to abduct her arm. She has  subsequently been evaluated with MRIs and exam and found to have massive  rotator cuff tear with associated tender retraction, muscle edema, atrophy  and possible osteochondral injury to the humeral head.  She was evaluated by  Dr. August Saucer and the conclusion was that she had arthropathy for which humeral  head prosthesis was indicated.  Surgery was fully discussed along with the  fact there were no guarantees.  The patient elected to proceed on with same.   PAST MEDICAL HISTORY:  The patient is allergic to Codeine and Lithium.   CURRENT MEDICATIONS:  Tiazac 360 mg one daily, Estratest tablets one daily,  Advair 500/50 one puff b.i.d., Propanolol 20 mg one  daily, Effexor XR  37.5/75 mg b.i.d., quinine 250 mg one at h.s. p.r.n., MetroGel 0.75% b.i.d.,  Protonix 40 mg one daily, Topamax 25 mg three b.i.d.,  Triamterine/hydrochlorothiazide 37.5/25 one daily (generic for Maxzide),  albuterol inhalation p.r.n.   REVIEW OF SYSTEMS:  Smokes a little less than a pack of cigarettes per day.  She is current with mammograms and Pap smears.  She has a history of asthma, bronchitis and hypertension.   PHYSICAL EXAMINATION:  VITAL SIGNS: She is 5 feet, 6 inches.  Weight 114  pounds.  Blood pressure 122/74, temperature 98.4, pulse 68, respirations 12.  HEENT: Extraocular movements are full.  Tympanic membranes appear normal.  Pharynx clear. Teeth are in good repair.  NECK:  Has full free range of motion, no palpable adenopathy or enlarged  thyroid. No carotid bruits.  CHEST: Good expansion, breath sounds clear.  HEART: Regular rhythm, no murmurs heard.  ABDOMEN: Flat, soft, nontender, no palpable masses.  Bowel sounds normal.  RIGHT UPPER EXTREMITY: Motors about the right shoulder are very weak and is  painful to test.  Her biceps, triceps and forearm function is intact.  She  has good peripheral circulation.   X-rays show a large old rotator cuff tear with retraction, some type 3  changes of the acromion and possible osteochondral injury to the humeral  head.   ADMISSION DIAGNOSES:  1. Right shoulder rotator cuff arthropathy.  2. Asthma.  3. Hypertension.   PLAN:  Per Dr. Dorene Grebe, right humeral head arthroplasty as fully  discussed by him.  She knows there are no guarantees.                                                Reynolds Bowl, M.D.    JWK/MEDQ  D:  09/24/2002  T:  09/24/2002  Job:  [id number]

## 2010-12-18 NOTE — Op Note (Signed)
NAMECLAUDIA, Colleen [name]                ACCOUNT NO.:  [id number]   MEDICAL RECORD NO.:  [id number]          PATIENT TYPE:  INP   LOCATION:  3012                         FACILITY:  [facility]   PHYSICIAN:  Hilda Lias, M.D.   DATE OF BIRTH:  [birth year]   DATE OF PROCEDURE:  02/12/2005  DATE OF DISCHARGE:                                 OPERATIVE REPORT   PREOPERATIVE DIAGNOSIS:  Cervical stenosis, C5-C6 and C6-C7, with chronic  radiculopathy, status post fusion at the level of C5-C6 and C6-C7  anteriorly.   POSTOPERATIVE DIAGNOSIS:  Cervical stenosis, C5-C6 and C6-C7, with chronic  radiculopathy, status post fusion at the level of C5-C6 and C6-C7  anteriorly.   PROCEDURE:  Posterior bilateral C5, C6, C7 laminectomy and foraminotomy to  decompress the C5, C6, and C7 nerve roots, lateral masses screws,  posterolateral arthrodesis with BMP and autograft.   SURGEON:  Hilda Lias, M.D.   ASSISTANT:  Stefani Dama, M.D.   CLINICAL HISTORY:  The patient was admitted for persistent neck pain with  burning sensation and pain and weakness of the deltoid and biceps.  X-ray  showed stenosis at the level of C5-C6 and C6-C7 with decompression.  Surgery  was advised and the risks were explained during the history and physical.   DESCRIPTION OF PROCEDURE:  The patient was taken to the OR and after  intubation, she was positioned in a prone manner.  The neck was prepped with  Betadine, drapes were applied.  A midline incision from C2 down to C7-T1 was  made.  The muscles were retracted laterally.  The patient had quite a bit of  vascular with the muscle and hemostasis was accomplished.  At the end, we  had a good visualization of the spinous process, lamina, all the way to the  facet.  We counted the spinous process from the top down and we visualized  and felt the C5, C6, and C7.  With the Leksell, we removed the spinous  process.  We found, again, that her bones were really hard,  osteopetrotic.  Then, with the drill, we drilled through the lamina bilaterally leaving the  facet intact.  With the Kerrison punch, using 1, 2, and 3 mm, we did the  foraminotomy to decompress the C5, C6, and C7 nerve roots, and we found that  C5 was the worse one, the left worse than the right.  Having done  foraminotomy and with the help of the C-arm, we went into the facet and  following the landmarks, we drilled into the facet followed by a probe  introducing lateral mass screws of 40 mm into the facet and pedicles.  This  procedure was done at the level of C5, C6, and C7, bilaterally.  The AP and  lateral showed good position.  Having done this, a rod was inserted and the  rod was secured in place along with the screw with a cap.  We went laterally  and removed the joint of C5-C6 and C6-C7 facet ligament with the lateral  aspect of the facet.  Then, a mix of BMP  as well as autograft was used for  lateral arthrodesis.  From then on, the area was irrigated, hemostasis was  done with bipolar.  The wound was closed with Vicryl and Steri-Strips.  The  patient was transferred to the PACU.     EB/MEDQ  D:  02/12/2005  T:  02/12/2005  Job:  [id number]

## 2011-05-13 LAB — CBC
HCT: 36.8
Hemoglobin: 12.2
MCV: 85.1
Platelets: 368
WBC: 9.6

## 2011-05-13 LAB — BASIC METABOLIC PANEL
BUN: 35 — ABNORMAL HIGH
Chloride: 104
Potassium: 3.9
Sodium: 136

## 2011-11-19 ENCOUNTER — Other Ambulatory Visit: Payer: Self-pay | Admitting: Obstetrics & Gynecology

## 2011-11-19 DIAGNOSIS — Z1231 Encounter for screening mammogram for malignant neoplasm of breast: Secondary | ICD-10-CM

## 2011-12-02 ENCOUNTER — Ambulatory Visit
Admission: RE | Admit: 2011-12-02 | Discharge: 2011-12-02 | Disposition: A | Discharge status: Home / Self Care | Payer: PRIVATE HEALTH INSURANCE | Source: Ambulatory Visit | Attending: Obstetrics & Gynecology | Admitting: Obstetrics & Gynecology

## 2011-12-02 DIAGNOSIS — Z1231 Encounter for screening mammogram for malignant neoplasm of breast: Secondary | ICD-10-CM

## 2012-08-28 ENCOUNTER — Other Ambulatory Visit: Payer: Self-pay | Admitting: Neurosurgery

## 2012-08-28 DIAGNOSIS — M549 Dorsalgia, unspecified: Secondary | ICD-10-CM

## 2012-08-28 DIAGNOSIS — M5410 Radiculopathy, site unspecified: Secondary | ICD-10-CM

## 2012-08-28 DIAGNOSIS — M542 Cervicalgia: Secondary | ICD-10-CM

## 2012-09-04 ENCOUNTER — Ambulatory Visit
Admission: RE | Admit: 2012-09-04 | Discharge: 2012-09-04 | Disposition: A | Discharge status: Home / Self Care | Payer: PRIVATE HEALTH INSURANCE | Source: Ambulatory Visit | Attending: Neurosurgery | Admitting: Neurosurgery

## 2012-09-04 VITALS — BP 150/83 | HR 72 | Ht 64.0 in | Wt 100.0 lb

## 2012-09-04 DIAGNOSIS — M549 Dorsalgia, unspecified: Secondary | ICD-10-CM

## 2012-09-04 DIAGNOSIS — M5410 Radiculopathy, site unspecified: Secondary | ICD-10-CM

## 2012-09-04 DIAGNOSIS — M542 Cervicalgia: Secondary | ICD-10-CM

## 2012-09-04 MED ORDER — ONDANSETRON HCL 4 MG/2ML IJ SOLN: 4.0000 mg | Freq: Four times a day (QID) | INTRAMUSCULAR | Status: DC | PRN

## 2012-09-04 MED ORDER — IOHEXOL 300 MG/ML  SOLN
9.0000 mL | Freq: Once | INTRAMUSCULAR | Status: AC | PRN
  Administered 2012-09-04: 9 mL via INTRATHECAL

## 2012-09-04 MED ORDER — DIAZEPAM 5 MG PO TABS
5.0000 mg | ORAL_TABLET | Freq: Once | ORAL | Status: AC
  Administered 2012-09-04: 5 mg via ORAL

## 2012-09-04 MED ORDER — ONDANSETRON HCL 4 MG/2ML IJ SOLN
4.0000 mg | Freq: Once | INTRAMUSCULAR | Status: AC
  Administered 2012-09-04: 4 mg via INTRAMUSCULAR

## 2012-09-04 MED ORDER — MEPERIDINE HCL 100 MG/ML IJ SOLN
50.0000 mg | Freq: Once | INTRAMUSCULAR | Status: AC
  Administered 2012-09-04: 50 mg via INTRAMUSCULAR

## 2012-09-04 NOTE — Progress Notes (Signed)
Pt has been off vibryd for the past 2 days.

## 2012-09-16 ENCOUNTER — Other Ambulatory Visit: Payer: Self-pay

## 2012-09-19 ENCOUNTER — Other Ambulatory Visit: Payer: Self-pay | Admitting: Gastroenterology

## 2012-09-19 DIAGNOSIS — R1310 Dysphagia, unspecified: Secondary | ICD-10-CM

## 2012-09-27 ENCOUNTER — Ambulatory Visit
Admission: RE | Admit: 2012-09-27 | Discharge: 2012-09-27 | Disposition: A | Discharge status: Home / Self Care | Payer: PRIVATE HEALTH INSURANCE | Source: Ambulatory Visit | Attending: Gastroenterology | Admitting: Gastroenterology

## 2013-01-10 ENCOUNTER — Ambulatory Visit: Payer: Self-pay | Admitting: Neurology

## 2013-06-07 ENCOUNTER — Other Ambulatory Visit: Payer: Self-pay

## 2014-05-07 ENCOUNTER — Other Ambulatory Visit: Payer: Self-pay | Admitting: Neurosurgery

## 2014-05-07 DIAGNOSIS — M545 Low back pain: Secondary | ICD-10-CM

## 2014-05-07 DIAGNOSIS — M4802 Spinal stenosis, cervical region: Secondary | ICD-10-CM

## 2014-12-10 ENCOUNTER — Other Ambulatory Visit (HOSPITAL_COMMUNITY): Payer: Self-pay | Admitting: Obstetrics & Gynecology

## 2014-12-11 LAB — CYTOLOGY - PAP

## 2015-06-14 ENCOUNTER — Encounter (HOSPITAL_COMMUNITY)
Admission: EM | Disposition: A | Discharge status: Skilled Nursing Facility | Payer: Self-pay | Source: Home / Self Care | Attending: Pulmonary Disease

## 2015-06-14 ENCOUNTER — Inpatient Hospital Stay (HOSPITAL_COMMUNITY)
Admission: EM | Admit: 2015-06-14 | Discharge: 2015-06-20 | DRG: 326 | Disposition: A | Discharge status: Skilled Nursing Facility | Payer: PRIVATE HEALTH INSURANCE | Attending: Internal Medicine | Admitting: Internal Medicine

## 2015-06-14 ENCOUNTER — Encounter (HOSPITAL_COMMUNITY): Payer: Self-pay

## 2015-06-14 ENCOUNTER — Emergency Department (HOSPITAL_COMMUNITY): Payer: PRIVATE HEALTH INSURANCE | Admitting: Anesthesiology

## 2015-06-14 ENCOUNTER — Inpatient Hospital Stay (HOSPITAL_COMMUNITY): Payer: PRIVATE HEALTH INSURANCE

## 2015-06-14 ENCOUNTER — Emergency Department (HOSPITAL_COMMUNITY): Payer: PRIVATE HEALTH INSURANCE

## 2015-06-14 DIAGNOSIS — K668 Other specified disorders of peritoneum: Secondary | ICD-10-CM | POA: Diagnosis present

## 2015-06-14 DIAGNOSIS — E875 Hyperkalemia: Secondary | ICD-10-CM | POA: Diagnosis present

## 2015-06-14 DIAGNOSIS — E860 Dehydration: Secondary | ICD-10-CM | POA: Diagnosis present

## 2015-06-14 DIAGNOSIS — E876 Hypokalemia: Secondary | ICD-10-CM | POA: Diagnosis not present

## 2015-06-14 DIAGNOSIS — N170 Acute kidney failure with tubular necrosis: Secondary | ICD-10-CM | POA: Diagnosis present

## 2015-06-14 DIAGNOSIS — I4510 Unspecified right bundle-branch block: Secondary | ICD-10-CM | POA: Diagnosis present

## 2015-06-14 DIAGNOSIS — J9601 Acute respiratory failure with hypoxia: Secondary | ICD-10-CM | POA: Diagnosis not present

## 2015-06-14 DIAGNOSIS — Z8701 Personal history of pneumonia (recurrent): Secondary | ICD-10-CM

## 2015-06-14 DIAGNOSIS — D649 Anemia, unspecified: Secondary | ICD-10-CM | POA: Diagnosis present

## 2015-06-14 DIAGNOSIS — K219 Gastro-esophageal reflux disease without esophagitis: Secondary | ICD-10-CM | POA: Diagnosis present

## 2015-06-14 DIAGNOSIS — J45909 Unspecified asthma, uncomplicated: Secondary | ICD-10-CM | POA: Diagnosis present

## 2015-06-14 DIAGNOSIS — Z885 Allergy status to narcotic agent status: Secondary | ICD-10-CM | POA: Diagnosis not present

## 2015-06-14 DIAGNOSIS — E162 Hypoglycemia, unspecified: Secondary | ICD-10-CM | POA: Diagnosis not present

## 2015-06-14 DIAGNOSIS — J441 Chronic obstructive pulmonary disease with (acute) exacerbation: Secondary | ICD-10-CM | POA: Diagnosis not present

## 2015-06-14 DIAGNOSIS — R739 Hyperglycemia, unspecified: Secondary | ICD-10-CM | POA: Diagnosis present

## 2015-06-14 DIAGNOSIS — K275 Chronic or unspecified peptic ulcer, site unspecified, with perforation: Secondary | ICD-10-CM | POA: Diagnosis not present

## 2015-06-14 DIAGNOSIS — J449 Chronic obstructive pulmonary disease, unspecified: Secondary | ICD-10-CM | POA: Diagnosis present

## 2015-06-14 DIAGNOSIS — E874 Mixed disorder of acid-base balance: Secondary | ICD-10-CM | POA: Diagnosis present

## 2015-06-14 DIAGNOSIS — E861 Hypovolemia: Secondary | ICD-10-CM | POA: Diagnosis not present

## 2015-06-14 DIAGNOSIS — I10 Essential (primary) hypertension: Secondary | ICD-10-CM | POA: Diagnosis present

## 2015-06-14 DIAGNOSIS — J9600 Acute respiratory failure, unspecified whether with hypoxia or hypercapnia: Secondary | ICD-10-CM | POA: Insufficient documentation

## 2015-06-14 DIAGNOSIS — N179 Acute kidney failure, unspecified: Secondary | ICD-10-CM | POA: Diagnosis not present

## 2015-06-14 DIAGNOSIS — F329 Major depressive disorder, single episode, unspecified: Secondary | ICD-10-CM | POA: Diagnosis present

## 2015-06-14 DIAGNOSIS — Z9981 Dependence on supplemental oxygen: Secondary | ICD-10-CM

## 2015-06-14 DIAGNOSIS — Z978 Presence of other specified devices: Secondary | ICD-10-CM

## 2015-06-14 DIAGNOSIS — A419 Sepsis, unspecified organism: Secondary | ICD-10-CM | POA: Diagnosis present

## 2015-06-14 DIAGNOSIS — K650 Generalized (acute) peritonitis: Secondary | ICD-10-CM | POA: Diagnosis present

## 2015-06-14 DIAGNOSIS — K255 Chronic or unspecified gastric ulcer with perforation: Secondary | ICD-10-CM | POA: Diagnosis present

## 2015-06-14 DIAGNOSIS — F17210 Nicotine dependence, cigarettes, uncomplicated: Secondary | ICD-10-CM | POA: Diagnosis present

## 2015-06-14 DIAGNOSIS — E871 Hypo-osmolality and hyponatremia: Secondary | ICD-10-CM | POA: Diagnosis present

## 2015-06-14 DIAGNOSIS — R0602 Shortness of breath: Secondary | ICD-10-CM | POA: Diagnosis present

## 2015-06-14 HISTORY — DX: Emphysema, unspecified: J43.9

## 2015-06-14 HISTORY — DX: Headache, unspecified: R51.9

## 2015-06-14 HISTORY — DX: Cardiac murmur, unspecified: R01.1

## 2015-06-14 HISTORY — DX: Major depressive disorder, single episode, unspecified: F32.9

## 2015-06-14 HISTORY — DX: Unspecified osteoarthritis, unspecified site: M19.90

## 2015-06-14 HISTORY — DX: Headache: R51

## 2015-06-14 HISTORY — DX: Unspecified asthma, uncomplicated: J45.909

## 2015-06-14 HISTORY — DX: Reserved for inherently not codable concepts without codable children: IMO0001

## 2015-06-14 HISTORY — DX: Chronic obstructive pulmonary disease, unspecified: J44.9

## 2015-06-14 HISTORY — DX: Gastro-esophageal reflux disease without esophagitis: K21.9

## 2015-06-14 HISTORY — DX: Depression, unspecified: F32.A

## 2015-06-14 HISTORY — PX: LAPAROTOMY: SHX154

## 2015-06-14 HISTORY — DX: Essential (primary) hypertension: I10

## 2015-06-14 LAB — CBC WITH DIFFERENTIAL/PLATELET
Basophils Absolute: 0 10*3/uL (ref 0.0–0.1)
Basophils Relative: 1 %
Eosinophils Absolute: 0 10*3/uL (ref 0.0–0.7)
Eosinophils Relative: 0 %
HEMATOCRIT: 37.8 % (ref 36.0–46.0)
HEMOGLOBIN: 12.4 g/dL (ref 12.0–15.0)
LYMPHS ABS: 0.7 10*3/uL (ref 0.7–4.0)
Lymphocytes Relative: 27 %
MCH: 28 pg (ref 26.0–34.0)
MCHC: 32.8 g/dL (ref 30.0–36.0)
MCV: 85.3 fL (ref 78.0–100.0)
MONOS PCT: 7 %
Monocytes Absolute: 0.2 10*3/uL (ref 0.1–1.0)
NEUTROS ABS: 1.8 10*3/uL (ref 1.7–7.7)
NEUTROS PCT: 65 %
Platelets: 544 10*3/uL — ABNORMAL HIGH (ref 150–400)
RBC: 4.43 MIL/uL (ref 3.87–5.11)
RDW: 15.2 % (ref 11.5–15.5)
WBC: 2.7 10*3/uL — ABNORMAL LOW (ref 4.0–10.5)

## 2015-06-14 LAB — BASIC METABOLIC PANEL
Anion gap: 8 (ref 5–15)
Anion gap: 11 (ref 5–15)
BUN: 65 mg/dL — ABNORMAL HIGH (ref 6–20)
BUN: 63 mg/dL — ABNORMAL HIGH (ref 6–20)
CALCIUM: 6.9 mg/dL — AB (ref 8.9–10.3)
CALCIUM: 7.2 mg/dL — AB (ref 8.9–10.3)
CO2: 16 mmol/L — AB (ref 22–32)
CO2: 18 mmol/L — AB (ref 22–32)
CREATININE: 3.26 mg/dL — AB (ref 0.44–1.00)
CREATININE: 3.15 mg/dL — AB (ref 0.44–1.00)
Chloride: 111 mmol/L (ref 101–111)
Chloride: 108 mmol/L (ref 101–111)
GFR calc Af Amer: 16 mL/min — ABNORMAL LOW (ref >60)
GFR calc Af Amer: 17 mL/min — ABNORMAL LOW (ref >60)
GFR calc non Af Amer: 14 mL/min — ABNORMAL LOW (ref >60)
GFR calc non Af Amer: 15 mL/min — ABNORMAL LOW (ref >60)
GLUCOSE: 158 mg/dL — AB (ref 65–99)
GLUCOSE: 188 mg/dL — AB (ref 65–99)
Potassium: 5.7 mmol/L — ABNORMAL HIGH (ref 3.5–5.1)
Potassium: 5 mmol/L (ref 3.5–5.1)
Sodium: 135 mmol/L (ref 135–145)
Sodium: 137 mmol/L (ref 135–145)

## 2015-06-14 LAB — BLOOD GAS, ARTERIAL
ACID-BASE DEFICIT: 13.1 mmol/L — AB (ref 0.0–2.0)
Bicarbonate: 12.8 mEq/L — ABNORMAL LOW (ref 20.0–24.0)
DRAWN BY: 257701
FIO2: 0.5
O2 SAT: 99.1 %
PCO2 ART: 30.8 mmHg — AB (ref 35.0–45.0)
PEEP: 5 cmH2O
PH ART: 7.242 — AB (ref 7.350–7.450)
Patient temperature: 98.6
RATE: 14 resp/min
TCO2: 13.7 mmol/L (ref 0–100)
VT: 470 mL
pO2, Arterial: 232 mmHg — ABNORMAL HIGH (ref 80.0–100.0)

## 2015-06-14 LAB — URINALYSIS, ROUTINE W REFLEX MICROSCOPIC
BILIRUBIN URINE: NEGATIVE
GLUCOSE, UA: NEGATIVE mg/dL
KETONES UR: NEGATIVE mg/dL
Leukocytes, UA: NEGATIVE
NITRITE: NEGATIVE
PH: 5 (ref 5.0–8.0)
PROTEIN: 30 mg/dL — AB
Specific Gravity, Urine: 1.011 (ref 1.005–1.030)
Urobilinogen, UA: 0.2 mg/dL (ref 0.0–1.0)

## 2015-06-14 LAB — COMPREHENSIVE METABOLIC PANEL
ALBUMIN: 3.7 g/dL (ref 3.5–5.0)
ALT: 13 U/L — AB (ref 14–54)
ANION GAP: 20 — AB (ref 5–15)
AST: 27 U/L (ref 15–41)
Alkaline Phosphatase: 77 U/L (ref 38–126)
BUN: 78 mg/dL — ABNORMAL HIGH (ref 6–20)
CHLORIDE: 98 mmol/L — AB (ref 101–111)
CO2: 12 mmol/L — AB (ref 22–32)
CREATININE: 3.94 mg/dL — AB (ref 0.44–1.00)
Calcium: 9.2 mg/dL (ref 8.9–10.3)
GFR calc non Af Amer: 11 mL/min — ABNORMAL LOW (ref >60)
GFR, EST AFRICAN AMERICAN: 13 mL/min — AB (ref >60)
GLUCOSE: 147 mg/dL — AB (ref 65–99)
Potassium: 5.8 mmol/L — ABNORMAL HIGH (ref 3.5–5.1)
SODIUM: 130 mmol/L — AB (ref 135–145)
Total Bilirubin: 0.4 mg/dL (ref 0.3–1.2)
Total Protein: 7.5 g/dL (ref 6.5–8.1)

## 2015-06-14 LAB — I-STAT CG4 LACTIC ACID, ED: Lactic Acid, Venous: 4.26 mmol/L (ref 0.5–2.0)

## 2015-06-14 LAB — TRIGLYCERIDES: TRIGLYCERIDES: 113 mg/dL (ref <150)

## 2015-06-14 LAB — GLUCOSE, CAPILLARY
GLUCOSE-CAPILLARY: 97 mg/dL (ref 65–99)
Glucose-Capillary: 88 mg/dL (ref 65–99)
Glucose-Capillary: 139 mg/dL — ABNORMAL HIGH (ref 65–99)

## 2015-06-14 LAB — URINE MICROSCOPIC-ADD ON

## 2015-06-14 LAB — MRSA PCR SCREENING: MRSA by PCR: NEGATIVE

## 2015-06-14 LAB — LACTIC ACID, PLASMA: Lactic Acid, Venous: 1.3 mmol/L (ref 0.5–2.0)

## 2015-06-14 LAB — TROPONIN I: Troponin I: <0.03 ng/mL (ref <0.031)

## 2015-06-14 LAB — INFLUENZA PANEL BY PCR (TYPE A & B)
H1N1FLUPCR: NOT DETECTED
Influenza A By PCR: NEGATIVE
Influenza B By PCR: NEGATIVE

## 2015-06-14 LAB — BRAIN NATRIURETIC PEPTIDE: B Natriuretic Peptide: 29.8 pg/mL (ref 0.0–100.0)

## 2015-06-14 SURGERY — LAPAROTOMY, EXPLORATORY
Anesthesia: General | Site: Abdomen

## 2015-06-14 MED ORDER — SODIUM CHLORIDE 0.9 % IV SOLN
INTRAVENOUS | Status: DC
  Administered 2015-06-14: 13:00:00 via INTRAVENOUS

## 2015-06-14 MED ORDER — FENTANYL CITRATE (PF) 100 MCG/2ML IJ SOLN
100.0000 ug | INTRAMUSCULAR | Status: DC | PRN
  Filled 2015-06-14: qty 2

## 2015-06-14 MED ORDER — SODIUM CHLORIDE 0.9 % IV BOLUS (SEPSIS): 500.0000 mL | INTRAVENOUS | Status: AC

## 2015-06-14 MED ORDER — SODIUM BICARBONATE 8.4 % IV SOLN
INTRAVENOUS | Status: DC
  Administered 2015-06-14 – 2015-06-15 (×3): via INTRAVENOUS
  Filled 2015-06-14 (×5): qty 150

## 2015-06-14 MED ORDER — FENTANYL CITRATE (PF) 250 MCG/5ML IJ SOLN
INTRAMUSCULAR | Status: AC
  Filled 2015-06-14: qty 5

## 2015-06-14 MED ORDER — PIPERACILLIN-TAZOBACTAM 3.375 G IVPB 30 MIN
3.3750 g | Freq: Once | INTRAVENOUS | Status: AC
  Administered 2015-06-14: 3.375 g via INTRAVENOUS
  Filled 2015-06-14: qty 50

## 2015-06-14 MED ORDER — ROCURONIUM BROMIDE 100 MG/10ML IV SOLN
INTRAVENOUS | Status: DC | PRN
  Administered 2015-06-14: 50 mg via INTRAVENOUS

## 2015-06-14 MED ORDER — ONDANSETRON HCL 4 MG/2ML IJ SOLN
4.0000 mg | Freq: Once | INTRAMUSCULAR | Status: DC
  Filled 2015-06-14: qty 2

## 2015-06-14 MED ORDER — PIPERACILLIN-TAZOBACTAM IN DEX 2-0.25 GM/50ML IV SOLN
2.2500 g | Freq: Three times a day (TID) | INTRAVENOUS | Status: DC
  Administered 2015-06-14 – 2015-06-17 (×9): 2.25 g via INTRAVENOUS
  Filled 2015-06-14 (×12): qty 50

## 2015-06-14 MED ORDER — PIPERACILLIN-TAZOBACTAM 3.375 G IVPB: 3.3750 g | Freq: Three times a day (TID) | INTRAVENOUS | Status: DC

## 2015-06-14 MED ORDER — ANTISEPTIC ORAL RINSE SOLUTION (CORINZ)
7.0000 mL | Freq: Four times a day (QID) | OROMUCOSAL | Status: DC
  Administered 2015-06-14 – 2015-06-18 (×15): 7 mL via OROMUCOSAL

## 2015-06-14 MED ORDER — PROPOFOL 1000 MG/100ML IV EMUL
0.0000 ug/kg/min | INTRAVENOUS | Status: DC
  Administered 2015-06-14 – 2015-06-15 (×2): 25 ug/kg/min via INTRAVENOUS
  Filled 2015-06-14 (×2): qty 100

## 2015-06-14 MED ORDER — PROPOFOL 500 MG/50ML IV EMUL
INTRAVENOUS | Status: DC | PRN
  Administered 2015-06-14: 50 ug/kg/min via INTRAVENOUS

## 2015-06-14 MED ORDER — 0.9 % SODIUM CHLORIDE (POUR BTL) OPTIME
TOPICAL | Status: DC | PRN
  Administered 2015-06-14: 4000 mL

## 2015-06-14 MED ORDER — SODIUM CHLORIDE 0.9 % IV BOLUS (SEPSIS)
1000.0000 mL | Freq: Once | INTRAVENOUS | Status: AC
  Administered 2015-06-14: 1000 mL via INTRAVENOUS

## 2015-06-14 MED ORDER — ROCURONIUM BROMIDE 50 MG/5ML IV SOLN
INTRAVENOUS | Status: AC
  Filled 2015-06-14: qty 1

## 2015-06-14 MED ORDER — IPRATROPIUM-ALBUTEROL 0.5-2.5 (3) MG/3ML IN SOLN
3.0000 mL | Freq: Four times a day (QID) | RESPIRATORY_TRACT | Status: DC
  Administered 2015-06-14 – 2015-06-16 (×8): 3 mL via RESPIRATORY_TRACT
  Filled 2015-06-14 (×8): qty 3

## 2015-06-14 MED ORDER — CHLORHEXIDINE GLUCONATE 0.12% ORAL RINSE (MEDLINE KIT)
15.0000 mL | Freq: Two times a day (BID) | OROMUCOSAL | Status: DC
  Administered 2015-06-14 – 2015-06-18 (×6): 15 mL via OROMUCOSAL

## 2015-06-14 MED ORDER — SODIUM CHLORIDE 0.9 % IV SOLN
1.0000 g | Freq: Once | INTRAVENOUS | Status: AC
  Administered 2015-06-14: 1 g via INTRAVENOUS
  Filled 2015-06-14: qty 10

## 2015-06-14 MED ORDER — MORPHINE SULFATE (PF) 2 MG/ML IV SOLN
2.0000 mg | Freq: Once | INTRAVENOUS | Status: DC
  Filled 2015-06-14: qty 1

## 2015-06-14 MED ORDER — SUCCINYLCHOLINE CHLORIDE 20 MG/ML IJ SOLN
INTRAMUSCULAR | Status: AC
  Filled 2015-06-14: qty 1

## 2015-06-14 MED ORDER — ENOXAPARIN SODIUM 30 MG/0.3ML ~~LOC~~ SOLN
30.0000 mg | SUBCUTANEOUS | Status: DC
  Administered 2015-06-15 – 2015-06-17 (×2): 30 mg via SUBCUTANEOUS
  Filled 2015-06-14 (×3): qty 0.3

## 2015-06-14 MED ORDER — PHENYLEPHRINE 40 MCG/ML (10ML) SYRINGE FOR IV PUSH (FOR BLOOD PRESSURE SUPPORT)
PREFILLED_SYRINGE | INTRAVENOUS | Status: AC
  Filled 2015-06-14: qty 10

## 2015-06-14 MED ORDER — LIDOCAINE HCL (CARDIAC) 20 MG/ML IV SOLN
INTRAVENOUS | Status: DC | PRN
  Administered 2015-06-14: 100 mg via INTRAVENOUS

## 2015-06-14 MED ORDER — SODIUM CHLORIDE 0.9 % IV BOLUS (SEPSIS)
500.0000 mL | Freq: Once | INTRAVENOUS | Status: AC
  Administered 2015-06-14: 500 mL via INTRAVENOUS

## 2015-06-14 MED ORDER — FENTANYL CITRATE (PF) 100 MCG/2ML IJ SOLN
100.0000 ug | Freq: Once | INTRAMUSCULAR | Status: AC
  Administered 2015-06-14: 100 ug via INTRAVENOUS

## 2015-06-14 MED ORDER — PROPOFOL 10 MG/ML IV BOLUS
INTRAVENOUS | Status: AC
  Filled 2015-06-14: qty 20

## 2015-06-14 MED ORDER — MIDAZOLAM HCL 2 MG/2ML IJ SOLN
INTRAMUSCULAR | Status: DC | PRN
  Administered 2015-06-14 (×2): 2 mg via INTRAVENOUS

## 2015-06-14 MED ORDER — FENTANYL CITRATE (PF) 250 MCG/5ML IJ SOLN
INTRAMUSCULAR | Status: DC | PRN
  Administered 2015-06-14 (×3): 50 ug via INTRAVENOUS
  Administered 2015-06-14 (×2): 125 ug via INTRAVENOUS
  Administered 2015-06-14: 150 ug via INTRAVENOUS
  Administered 2015-06-14: 100 ug via INTRAVENOUS
  Administered 2015-06-14 (×2): 50 ug via INTRAVENOUS

## 2015-06-14 MED ORDER — SODIUM CHLORIDE 0.9 % IV SOLN
25.0000 ug/h | INTRAVENOUS | Status: DC
  Administered 2015-06-14: 50 ug/h via INTRAVENOUS
  Filled 2015-06-14 (×2): qty 50

## 2015-06-14 MED ORDER — LIDOCAINE HCL (CARDIAC) 20 MG/ML IV SOLN
INTRAVENOUS | Status: AC
  Filled 2015-06-14: qty 5

## 2015-06-14 MED ORDER — SODIUM CHLORIDE 0.9 % IV SOLN
INTRAVENOUS | Status: DC | PRN
  Administered 2015-06-14 (×4): via INTRAVENOUS

## 2015-06-14 MED ORDER — PANTOPRAZOLE SODIUM 40 MG IV SOLR
40.0000 mg | Freq: Two times a day (BID) | INTRAVENOUS | Status: DC
  Administered 2015-06-14 – 2015-06-19 (×11): 40 mg via INTRAVENOUS
  Filled 2015-06-14 (×12): qty 40

## 2015-06-14 MED ORDER — SUCCINYLCHOLINE CHLORIDE 20 MG/ML IJ SOLN
INTRAMUSCULAR | Status: DC | PRN
  Administered 2015-06-14: 100 mg via INTRAVENOUS

## 2015-06-14 MED ORDER — FENTANYL CITRATE (PF) 100 MCG/2ML IJ SOLN
100.0000 ug | INTRAMUSCULAR | Status: DC | PRN
  Administered 2015-06-15 – 2015-06-16 (×8): 100 ug via INTRAVENOUS
  Filled 2015-06-14 (×8): qty 2

## 2015-06-14 MED ORDER — WHITE PETROLATUM GEL
Status: DC | PRN
  Filled 2015-06-14: qty 28.35

## 2015-06-14 MED ORDER — MIDAZOLAM HCL 2 MG/2ML IJ SOLN
INTRAMUSCULAR | Status: AC
  Filled 2015-06-14: qty 4

## 2015-06-14 MED ORDER — SODIUM CHLORIDE 0.9 % IV SOLN
INTRAVENOUS | Status: DC | PRN
  Administered 2015-06-14: 11:00:00 via INTRAVENOUS

## 2015-06-14 MED ORDER — MIDAZOLAM HCL 2 MG/2ML IJ SOLN
INTRAMUSCULAR | Status: AC
  Filled 2015-06-14: qty 2

## 2015-06-14 MED ORDER — PROPOFOL 10 MG/ML IV BOLUS
INTRAVENOUS | Status: DC | PRN
  Administered 2015-06-14: 100 mg via INTRAVENOUS

## 2015-06-14 SURGICAL SUPPLY — 49 items
BLADE SURG ROTATE 9660 (MISCELLANEOUS) ×3 IMPLANT
BNDG GAUZE ELAST 4 BULKY (GAUZE/BANDAGES/DRESSINGS) ×3 IMPLANT
CANISTER SUCTION 2500CC (MISCELLANEOUS) ×3 IMPLANT
CHLORAPREP W/TINT 26ML (MISCELLANEOUS) ×3 IMPLANT
COVER MAYO STAND STRL (DRAPES) IMPLANT
COVER SURGICAL LIGHT HANDLE (MISCELLANEOUS) ×3 IMPLANT
DRAIN CHANNEL 19F RND (DRAIN) ×3 IMPLANT
DRAPE LAPAROSCOPIC ABDOMINAL (DRAPES) ×3 IMPLANT
DRAPE PROXIMA HALF (DRAPES) IMPLANT
DRAPE UTILITY XL STRL (DRAPES) ×6 IMPLANT
DRAPE WARM FLUID 44X44 (DRAPE) ×3 IMPLANT
DRSG OPSITE POSTOP 4X10 (GAUZE/BANDAGES/DRESSINGS) IMPLANT
DRSG OPSITE POSTOP 4X8 (GAUZE/BANDAGES/DRESSINGS) IMPLANT
DRSG PAD ABDOMINAL 8X10 ST (GAUZE/BANDAGES/DRESSINGS) ×3 IMPLANT
ELECT BLADE 6.5 EXT (BLADE) IMPLANT
ELECT CAUTERY BLADE 6.4 (BLADE) ×3 IMPLANT
ELECT REM PT RETURN 9FT ADLT (ELECTROSURGICAL) ×3
ELECTRODE REM PT RTRN 9FT ADLT (ELECTROSURGICAL) ×1 IMPLANT
EVACUATOR SILICONE 100CC (DRAIN) ×3 IMPLANT
GAUZE SPONGE 4X4 12PLY STRL (GAUZE/BANDAGES/DRESSINGS) ×3 IMPLANT
GLOVE BIO SURGEON STRL SZ8 (GLOVE) ×3 IMPLANT
GLOVE BIOGEL PI IND STRL 8 (GLOVE) ×1 IMPLANT
GLOVE BIOGEL PI INDICATOR 8 (GLOVE) ×2
GOWN STRL REUS W/ TWL LRG LVL3 (GOWN DISPOSABLE) ×2 IMPLANT
GOWN STRL REUS W/ TWL XL LVL3 (GOWN DISPOSABLE) ×1 IMPLANT
GOWN STRL REUS W/TWL LRG LVL3 (GOWN DISPOSABLE) ×6
GOWN STRL REUS W/TWL XL LVL3 (GOWN DISPOSABLE) ×3
KIT BASIN OR (CUSTOM PROCEDURE TRAY) ×3 IMPLANT
KIT ROOM TURNOVER OR (KITS) ×3 IMPLANT
LIGASURE IMPACT 36 18CM CVD LR (INSTRUMENTS) IMPLANT
NS IRRIG 1000ML POUR BTL (IV SOLUTION) ×15 IMPLANT
PACK GENERAL/GYN (CUSTOM PROCEDURE TRAY) ×3 IMPLANT
PAD ARMBOARD 7.5X6 YLW CONV (MISCELLANEOUS) ×3 IMPLANT
PENCIL BUTTON HOLSTER BLD 10FT (ELECTRODE) IMPLANT
SPECIMEN JAR LARGE (MISCELLANEOUS) IMPLANT
SPONGE LAP 18X18 X RAY DECT (DISPOSABLE) IMPLANT
STAPLER VISISTAT 35W (STAPLE) ×3 IMPLANT
SUCTION POOLE TIP (SUCTIONS) ×3 IMPLANT
SUT PDS AB 1 TP1 96 (SUTURE) ×6 IMPLANT
SUT SILK 2 0 SH CR/8 (SUTURE) ×3 IMPLANT
SUT SILK 2 0 TIES 10X30 (SUTURE) IMPLANT
SUT SILK 3 0 SH CR/8 (SUTURE) ×3 IMPLANT
SUT SILK 3 0 TIES 10X30 (SUTURE) IMPLANT
TAPE CLOTH SURG 6X10 WHT LF (GAUZE/BANDAGES/DRESSINGS) ×3 IMPLANT
TOWEL OR 17X26 10 PK STRL BLUE (TOWEL DISPOSABLE) ×3 IMPLANT
TRAY FOLEY CATH 16FRSI W/METER (SET/KITS/TRAYS/PACK) IMPLANT
TUBE CONNECTING 12'X1/4 (SUCTIONS)
TUBE CONNECTING 12X1/4 (SUCTIONS) IMPLANT
YANKAUER SUCT BULB TIP NO VENT (SUCTIONS) IMPLANT

## 2015-06-14 NOTE — Progress Notes (Signed)
Patient reports that she had super glued her bracelets on. Removed 2 bracelets by prying clasp open with hemostats. The other 2 bracelets I cut off with patient permission

## 2015-06-14 NOTE — ED Notes (Signed)
Colleen [name]--- nephew-- ONLY RELATIVE --- please call with any updates. [phone].

## 2015-06-14 NOTE — ED Notes (Signed)
Per EMS, Patient called for SOB. Patient was diagnosed with pneumonia two weeks ago. Pt has HX of COPD. Upon assessment, EMS noted crackles and wheezing in lung fields. Given 10 mg of Albuterol, 1 mg of Atrovent, and 125 mg of Solumedrol. Vitals per EMS: 115/93, 125 HR, 99% on Nebulizer, Initial O2 at 80% on RA.

## 2015-06-14 NOTE — Progress Notes (Signed)
RT requested secretary call xray for new tube placement.

## 2015-06-14 NOTE — Procedures (Signed)
Central Venous Catheter Insertion Procedure Note Colleen StarksYasmin H [name] [id number] 02/23/1951  Procedure: Insertion of Central Venous Catheter Indications: Drug and/or fluid administration and Frequent blood sampling  Procedure Details Consent: Risks of procedure as well as the alternatives and risks of each were explained to the (patient/caregiver).  Consent for procedure obtained. Time Out: Verified patient identification, verified procedure, site/side was marked, verified correct patient position, special equipment/implants available, medications/allergies/relevent history reviewed, required imaging and test results available.  Performed Real time US was used to ID and cannulate the vessel  Maximum sterile technique was used including antiseptics, cap, gloves, gown, hand hygiene, mask and sheet. Skin prep: Chlorhexidine; local anesthetic administered A antimicrobial bonded/coated triple lumen catheter was placed in the right internal jugular vein using the Seldinger technique.  Evaluation Blood flow good Complications: No apparent complications Patient did tolerate procedure well. Chest X-ray ordered to verify placement.  CXR: pending.   Colleen [name] 06/14/2015, 3:11 PM   I was present and supervised the entire procedure.  U/S used in placement.  Alyson ReedyWesam G. Yacoub, M.D. [facility] Pulmonary/Critical Care Medicine. Pager: [phone]. After hours pager: [phone].

## 2015-06-14 NOTE — Anesthesia Preprocedure Evaluation (Addendum)
Anesthesia Evaluation  Patient identified by MRN, date of birth, ID band Patient awake    Reviewed: Patient's Chart, lab work & pertinent test results  Airway Mallampati: II  TM Distance: >3 FB Neck ROM: Full    Dental  (+) Teeth Intact, Dental Advisory Given   Pulmonary asthma , COPD, Current Smoker,     + decreased breath sounds      Cardiovascular hypertension,  Rhythm:Regular Rate:Tachycardia     Neuro/Psych    GI/Hepatic Perforated viscus   Endo/Other    Renal/GU      Musculoskeletal   Abdominal   Peds  Hematology   Anesthesia Other Findings   Reproductive/Obstetrics                            Anesthesia Physical Anesthesia Plan  ASA: III and emergent  Anesthesia Plan: General   Post-op Pain Management:    Induction: Intravenous  Airway Management Planned: Oral ETT  Additional Equipment:   Intra-op Plan:   Post-operative Plan: Post-operative intubation/ventilation  Informed Consent:   Plan Discussed with:   Anesthesia Plan Comments:         Anesthesia Quick Evaluation

## 2015-06-14 NOTE — Anesthesia Postprocedure Evaluation (Signed)
  Anesthesia Post-op Note  Patient: Colleen [name]  Procedure(s) Performed: Procedure(s): EXPLORATORY LAPAROTOMY AND REPAIR OF PERFORATED ULCER. (N/A)  Patient Location: ICU  Anesthesia Type:General  Level of Consciousness: sedated, unresponsive and Patient remains intubated per anesthesia plan  Airway and Oxygen Therapy: Patient remains intubated per anesthesia plan  Post-op Pain: sedated, unable to evaluate  Post-op Assessment: Post-op Vital signs reviewed, Patient's Cardiovascular Status Stable, Respiratory Function Stable and Patent Airway              Post-op Vital Signs: Reviewed and stable  Last Vitals:  Filed Vitals:   06/14/15 0952  BP: 176/156  Pulse: 118  Temp: 36.9 C  Resp: 20    Complications: No apparent anesthesia complications

## 2015-06-14 NOTE — Progress Notes (Addendum)
ANTIBIOTIC CONSULT NOTE - INITIAL  Pharmacy Consult for zosyn Indication: intra-abd infection  Allergies  Allergen Reactions  . Codeine Itching         Patient Measurements:   45.4 kg on 09/04/12  Vital Signs: Temp: 97.5 F (36.4 C) (11/12 0747) Temp Source: Oral (11/12 0747) BP: 126/100 mmHg (11/12 0747) Pulse Rate: 118 (11/12 0747) Intake/Output from previous day:   Intake/Output from this shift:    Labs:  Recent Labs  06/14/15 0823  WBC 2.7*  HGB 12.4  PLT 544*   CrCl cannot be calculated (Unknown ideal weight.). No results for input(s): VANCOTROUGH, VANCOPEAK, VANCORANDOM, GENTTROUGH, GENTPEAK, GENTRANDOM, TOBRATROUGH, TOBRAPEAK, TOBRARND, AMIKACINPEAK, AMIKACINTROU, AMIKACIN in the last 72 hours.   Microbiology: No results found for this or any previous visit (from the past 720 hour(s)).  Medical History: Past Medical History  Diagnosis Date  . COPD (chronic obstructive pulmonary disease) (HCC)   . Hypertension   . Asthma   . Emphysema/COPD [facility](HCC)   Assessment: 64 yo F in ED.  Pharmacy consulted to dose zosyn for intra-abd infection. NWG:[id number]:pneumoperitoneum Wt 09/2012 = 45 kg.  WBC 2.7. AF; Creat 3.94, LA 4.26   Plan:  Zosyn 3.375 gm IV x 1 over 30 minutes then zosyn 2.25 gm IV q8h  Pharmacy to sign off Thanks Herby AbrahamMichelle T. Ashur Glatfelter, Pharm.D. [phone] 06/14/2015 9:12 AM

## 2015-06-14 NOTE — ED Notes (Signed)
Permit signed

## 2015-06-14 NOTE — Consult Note (Signed)
PULMONARY / CRITICAL CARE MEDICINE   Name: Colleen Colleen [name] MRN: [id number] DOB: 09/04/1950    ADMISSION DATE:  06/14/2015 CONSULTATION DATE:  11/12  REFERRING MD :  Janee Morn  CHIEF COMPLAINT:  Vent management and critical care   INITIAL PRESENTATION:  64 year old female w/ h/o Colleen [name], recently treated by PCP for PNA. Admitted on 11/12 w/ acute abd pain and dyspnea. Dx eval demonstrated pneumoperitoneum. Went for emergent exploratory lap. Found ruptured gastric ulcer. This was repaired, biopsies were obtained and JP tube placed. She returned to the ICU on the vent. PCCM asked to assume primary care.   STUDIES:    SIGNIFICANT EVENTS:    HISTORY OF PRESENT ILLNESS:    31 yof who called EMS on 11/12 with SOB and cough for a week, worse in the last 2 day. New fevers for 2 days. She says she called because it hurt so much when she coughed. No nausea, no vomiting, ate on 11/11 mid day, nothing since.She was treated for pneumonia a month ago by her PCP. She was seen on 11/11 and started on something for possible recurrent pneumonia at that time too. She has h/o severe Colleen [name], uses inhalers and O2 at home. She got solumedrol, Atrovent and albuterol by EMS and was transported to ED. Here is afebrile, tachycardic, WBC 2.7, lactate 4.26, and preliminary CXR shows Significant pneumoperitoneum, new since the prior chest radiographs. Lungs were clear. On surgical team arrival she was  cold, mottled, with diffuse peritonitis, core temp is 98 but she is shaking uncontrollably in ED. She was brought to the OR emergently for exploration. She was found to have perforated gastric ulcer. This was repaired, biopsies were sent and JP drain placed in the epigastrium. She was sent back to the SICU post-op on the vent. PCCM asked to assume care.  PAST MEDICAL HISTORY :   has a past medical history of Colleen [name] (HCC); Hypertension; Asthma; and Emphysema/Colleen [name] (HCC).  has past  surgical history that includes Abdominal hysterectomy; Knee surgery; and Rotator cuff repair. Prior to Admission medications   Medication Sig Start Date End Date Taking? Authorizing Provider  ADVAIR DISKUS 250-50 MCG/DOSE AEPB Inhale 2 puffs into the lungs 2 (two) times daily as needed. 04/05/15  Yes Historical Provider, MD  ALPHAGAN P 0.1 % SOLN Place 1 drop into both eyes 2 (two) times daily. 03/26/15  Yes Historical Provider, MD  BYSTOLIC 5 MG tablet Take 5 mg by mouth daily. 05/12/15  Yes Historical Provider, MD  chlorpheniramine-HYDROcodone (TUSSIONEX) 10-8 MG/5ML SUER Take 5 mLs by mouth as needed. 1 teaspoon as needed for cough 06/04/15  Yes Historical Provider, MD  clarithromycin (BIAXIN XL) 500 MG 24 hr tablet Take 1,000 mg by mouth daily. 06/04/15  Yes Historical Provider, MD  cyclobenzaprine (FLEXERIL) 10 MG tablet Take 10 mg by mouth as needed. Moderate pain 05/27/15  Yes Historical Provider, MD  fluconazole (DIFLUCAN) 200 MG tablet Take 1 tablet by mouth every other day. 06/04/15  Yes Historical Provider, MD  furosemide (LASIX) 40 MG tablet Take 40 mg by mouth as needed. 04/23/15  Yes Historical Provider, MD  latanoprost (XALATAN) 0.005 % ophthalmic solution Place 1 drop into both eyes. 1 drop in both eyes once daily at bed time 05/21/15  Yes Historical Provider, MD  levofloxacin (LEVAQUIN) 500 MG tablet Take 500 mg by mouth daily. 06/11/15  Yes Historical Provider, MD  meloxicam (MOBIC) 15 MG tablet Take 1 tablet by mouth as needed. Take one  tablet by mouth once daily with food for 14 days then take 1 daily as needed. 05/28/15  Yes Historical Provider, MD  metroNIDAZOLE (METROCREAM) 0.75 % cream  06/04/15  Yes Historical Provider, MD  PROAIR HFA 108 (90 BASE) MCG/ACT inhaler Inhale 1-2 puffs into the lungs every 4 (four) hours as needed. 04/08/15  Yes Historical Provider, MD  sertraline (ZOLOFT) 100 MG tablet Take 100 mg by mouth daily. 05/30/15  Yes Historical Provider, MD  zolpidem (AMBIEN) 10  MG tablet Take 1 tablet by mouth at bedtime as needed. 05/15/15  Yes Historical Provider, MD   Allergies  Allergen Reactions  . Codeine Itching         FAMILY HISTORY:  has no family status information on file.  SOCIAL HISTORY:  reports that she has been smoking Cigarettes.  She has a 35.2 pack-year smoking history. She has never used smokeless tobacco. She reports that she drinks alcohol. She reports that she does not use illicit drugs.  REVIEW OF SYSTEMS:  Unable   SUBJECTIVE: sedated on vent   VITAL SIGNS: Temp:  [97.5 F (36.4 C)-98.5 F (36.9 C)] 97.6 F (36.4 C) (11/12 1300) Pulse Rate:  [118] 118 (11/12 0952) Resp:  [18-25] 18 (11/12 1300) BP: (126-176)/(90-156) 158/90 mmHg (11/12 1300) SpO2:  [93 %-97 %] 97 % (11/12 1300) FiO2 (%):  [50 %-100 %] 50 % (11/12 1300) Weight:  [49 kg (108 lb 0.4 oz)] 49 kg (108 lb 0.4 oz) (11/12 1300) HEMODYNAMICS:   VENTILATOR SETTINGS: Vent Mode:  [-] PRVC FiO2 (%):  [50 %-100 %] 50 % Set Rate:  [14 bmp-18 bmp] 14 bmp Vt Set:  [470 mL] 470 mL PEEP:  [5 cmH20] 5 cmH20 Plateau Pressure:  [15 cmH20] 15 cmH20 INTAKE / OUTPUT:  Intake/Output Summary (Last 24 hours) at 06/14/15 1421 Last data filed at 06/14/15 1210  Gross per 24 hour  Intake   3500 ml  Output    205 ml  Net   3295 ml    PHYSICAL EXAMINATION: General:  Sedated on vent. Opens eyes. No distress.  Neuro:  Sedated. No focal def. Moves all ext  HEENT:  NCAT, no JVD orally intubated  Cardiovascular:  Tachy rrr no MRG  Lungs:  Clear, no wheeze, no accessory muscle use  Abdomen:  Mid abd dressing intact, hypoactive BC, NGT to LIWS w/ bilious drainage. JP charged w/ bilious drainage  Musculoskeletal:  Good tone, equal st  Skin:  Warm and dry, no edema   LABS:  CBC  Recent Labs Lab 06/14/15 0823  WBC 2.7*  HGB 12.4  HCT 37.8  PLT 544*   Coag's No results for input(s): APTT, INR in the last 168 hours. BMET  Recent Labs Lab 06/14/15 0823  NA 130*  K 5.8*   CL 98*  CO2 12*  BUN 78*  CREATININE 3.94*  GLUCOSE 147*   Electrolytes  Recent Labs Lab 06/14/15 0823  CALCIUM 9.2   Sepsis Markers  Recent Labs Lab 06/14/15 0919  LATICACIDVEN 4.26*   ABG No results for input(s): PHART, PCO2ART, PO2ART in the last 168 hours. Liver Enzymes  Recent Labs Lab 06/14/15 0823  AST 27  ALT 13*  ALKPHOS 77  BILITOT 0.4  ALBUMIN 3.7   Cardiac Enzymes  Recent Labs Lab 06/14/15 0823  TROPONINI <0.03   Glucose No results for input(s): GLUCAP in the last 168 hours.  Imaging Dg Chest 2 View  06/14/2015  CLINICAL DATA:  Short of breath. Diagnosed with pneumonia 2  weeks ago. History of Colleen [name] and hypertension. EXAM: CHEST  2 VIEW COMPARISON:  06/10/2015 FINDINGS: There is pneumoperitoneum below each hemidiaphragm, new from the prior study. Lungs are clear.  No pleural effusion or pneumothorax. Normal heart, mediastinum hila. Partly imaged right shoulder prosthesis appears well aligned and stable. IMPRESSION: 1. Significant pneumoperitoneum, new since the prior chest radiographs. 2. No acute cardiopulmonary disease. Electronically Signed   By: Amie Portlandavid  Ormond M.D.   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 Trickle feeds in next 24-48 hrs OR TNA   HEMATOLOGIC A:   No acute  P:  Trend CBC Transfuse for ICU protocol  LMWH per surgical team   INFECTIOUS A:   Acute peritonitis  P:   bcx2  11/12>>> Surgical culture 11/12>>> Zosyn 11/12>>>  ENDOCRINE A:   Mild hyperglycemia  P:   cbgs q 4 Start SSI if consistently > 150   NEUROLOGIC A:   Post-op pain  P:   RASS goal: -2 PAD protocol    FAMILY  - Updates:   - Inter-disciplinary family meet or Palliative Care meeting due by:  11/19    TODAY'S SUMMARY:  64 yr old female admitted w/ abd pain and dyspnea. Found to have perf gastric ulcer. Now s/p surgical repair. Returned to the ICU on vent. PCCM asked to assume primary care given severe metabolic derangements and underlying Colleen [name]. Will stabilize metabolic issues. If un-eventful hope to extubate in am.    Simonne MartinetPeter E Babcock ACNP-BC [facility] Pulmonary/Critical Care Pager # [phone] OR # [phone] if no answer  Attending Note:  64 year old female presenting to the ER for abdominal pain and SOB was found to have a gastric perf s/p surgical repair.  Colleen Colleen [name].  I reviewed CXR myself, severe Colleen [name] on CXR.  Distant BS on exam.  Sedated and intubated.  Hold weaning for today.  Mixed acidosis as discussed above.  Start bicarb drip.  Repeat labs and will readdress.  Hopefully can extubate in AM.  The Colleen is critically ill with multiple organ systems failure and requires high complexity decision making for assessment and support, frequent evaluation and titration of therapies, application of advanced monitoring technologies and extensive interpretation of multiple databases.   Critical Care Time devoted to Colleen care services described in this note is  35  Minutes. This time reflects time of care of this signee Dr Koren BoundWesam Yacoub. This critical care time does not reflect procedure time, or teaching time or supervisory time of PA/NP/Med student/Med Resident etc but could involve care discussion time.  Alyson ReedyWesam G. Yacoub, M.D. [facility] Pulmonary/Critical Care Medicine. Pager: [phone]. After  hours pager: [phone].  06/14/2015, 2:21 PM

## 2015-06-14 NOTE — Progress Notes (Signed)
2 RTs present. Advanced ETT from 18 cm teeth to 23cm lip per MD Vassie LollAlva. Uneventful. Resulted in bilateral breath sounds, receiving and returning VT, Sp02 96% (may adjust once ABG is completed). RN aware.

## 2015-06-14 NOTE — Consult Note (Signed)
Reason for Consult:  Pneumoperitoneum  PCP: Dr. Juleen ChinaKohut  Referring Physician: Dr. Everett Graff Little  Colleen [name] is an 64 y.o. female.  HPI: Pt called EMS with SOB, , cough for a week, worse in the last 2 day.  New fevers for 2 days. She says she called because it hurt so much when she coughed.  No nausea, no vomiting, ate yesterday mid day, nothing since. Last BM today or yesterday, she isn't sure, no blood with BM's.  She has been eating at home, no nausea, vomiting or diarrhea, last BM last 24 hours she is not sure.  She was treated for pneumonia a month ago by her PCP. She was seen on yesterday and started on something for possible recurrent pneumonia at that time too.  She has severe COPD, uses inhalers and O2 at home.  She got solumedrol, Atrovent and albuterol by EMS and was transported to ED.  Here is afebrile, tachycardic, WBC 2.7, lactate 4.26, and preliminary CXR shows Significant pneumoperitoneum, new since the prior chest radiographs.  Lungs are clear.  We are called and she is cold, mottled, with diffuse peritonitis, core temp is 98 but she is shaking uncontrollably here in the ED.  Past Medical History  Diagnosis Date  . COPD (chronic obstructive pulmonary disease)  Home O2 ongoing tobacco use   . Hypertension   . Asthma   . Emphysema/COPD [facility](HCC)     Past Surgical History  Procedure Laterality Date  . Abdominal hysterectomy    . Knee surgery      Bilateral  . Rotator cuff repair      No family history on file.    Social History:  reports that she has been smoking Cigarettes.  She has a 35.2 pack-year smoking history. She has never used smokeless tobacco. She reports that she drinks alcohol. She reports that she does not use illicit drugs.  Allergies:  Allergies  Allergen Reactions  . Codeine Itching         Prior to Admission medications   Medication Sig Start Date End Date Taking? Authorizing Provider  ADVAIR DISKUS 250-50 MCG/DOSE AEPB Inhale 2 puffs into the lungs 2  (two) times daily as needed. 04/05/15  Yes Historical Provider, MD  ALPHAGAN P 0.1 % SOLN Place 1 drop into both eyes 2 (two) times daily. 03/26/15  Yes Historical Provider, MD  BYSTOLIC 5 MG tablet Take 5 mg by mouth daily. 05/12/15  Yes Historical Provider, MD  chlorpheniramine-HYDROcodone (TUSSIONEX) 10-8 MG/5ML SUER Take 5 mLs by mouth as needed. 1 teaspoon as needed for cough 06/04/15  Yes Historical Provider, MD  clarithromycin (BIAXIN XL) 500 MG 24 hr tablet Take 1,000 mg by mouth daily. 06/04/15  Yes Historical Provider, MD  cyclobenzaprine (FLEXERIL) 10 MG tablet Take 10 mg by mouth as needed. Moderate pain 05/27/15  Yes Historical Provider, MD  fluconazole (DIFLUCAN) 200 MG tablet Take 1 tablet by mouth every other day. 06/04/15  Yes Historical Provider, MD  furosemide (LASIX) 40 MG tablet Take 40 mg by mouth as needed. 04/23/15  Yes Historical Provider, MD  latanoprost (XALATAN) 0.005 % ophthalmic solution Place 1 drop into both eyes. 1 drop in both eyes once daily at bed time 05/21/15  Yes Historical Provider, MD  levofloxacin (LEVAQUIN) 500 MG tablet Take 500 mg by mouth daily. 06/11/15  Yes Historical Provider, MD  meloxicam (MOBIC) 15 MG tablet Take 1 tablet by mouth as needed. Take one tablet by mouth once daily with food for 14  days then take 1 daily as needed. 05/28/15  Yes Historical Provider, MD  metroNIDAZOLE (METROCREAM) 0.75 % cream  06/04/15  Yes Historical Provider, MD  PROAIR HFA 108 (90 BASE) MCG/ACT inhaler Inhale 1-2 puffs into the lungs every 4 (four) hours as needed. 04/08/15  Yes Historical Provider, MD  sertraline (ZOLOFT) 100 MG tablet Take 100 mg by mouth daily. 05/30/15  Yes Historical Provider, MD  zolpidem (AMBIEN) 10 MG tablet Take 1 tablet by mouth at bedtime as needed. 05/15/15  Yes Historical Provider, MD   EKG sinus tachycardia  Results for orders placed or performed during the hospital encounter of 06/14/15 (from the past 48 hour(s))  CBC with Differential      Status: Abnormal   Collection Time: 06/14/15  8:23 AM  Result Value Ref Range   WBC 2.7 (L) 4.0 - 10.5 K/uL   RBC 4.43 3.87 - 5.11 MIL/uL   Hemoglobin 12.4 12.0 - 15.0 g/dL   HCT 16.1 09.6 - 04.5 %   MCV 85.3 78.0 - 100.0 fL   MCH 28.0 26.0 - 34.0 pg   MCHC 32.8 30.0 - 36.0 g/dL   RDW 40.9 81.1 - 91.4 %   Platelets 544 (H) 150 - 400 K/uL   Neutrophils Relative % 65 %   Neutro Abs 1.8 1.7 - 7.7 K/uL   Lymphocytes Relative 27 %   Lymphs Abs 0.7 0.7 - 4.0 K/uL   Monocytes Relative 7 %   Monocytes Absolute 0.2 0.1 - 1.0 K/uL   Eosinophils Relative 0 %   Eosinophils Absolute 0.0 0.0 - 0.7 K/uL   Basophils Relative 1 %   Basophils Absolute 0.0 0.0 - 0.1 K/uL  Brain natriuretic peptide     Status: None   Collection Time: 06/14/15  8:23 AM  Result Value Ref Range   B Natriuretic Peptide 29.8 0.0 - 100.0 pg/mL  I-Stat CG4 Lactic Acid, ED  (not at  [facility])     Status: Abnormal   Collection Time: 06/14/15  9:19 AM  Result Value Ref Range   Lactic Acid, Venous 4.26 (HH) 0.5 - 2.0 mmol/L   Comment NOTIFIED PHYSICIAN     Dg Chest 2 View  06/14/2015  CLINICAL DATA:  Short of breath. Diagnosed with pneumonia 2 weeks ago. History of COPD and hypertension. EXAM: CHEST  2 VIEW COMPARISON:  06/10/2015 FINDINGS: There is pneumoperitoneum below each hemidiaphragm, new from the prior study. Lungs are clear.  No pleural effusion or pneumothorax. Normal heart, mediastinum hila. Partly imaged right shoulder prosthesis appears well aligned and stable. IMPRESSION: 1. Significant pneumoperitoneum, new since the prior chest radiographs. 2. No acute cardiopulmonary disease. Electronically Signed   By: Amie Portland M.D.   On: 06/14/2015 08:47    Review of Systems  Constitutional: Positive for fever and chills. Negative for weight loss, malaise/fatigue and diaphoresis.  HENT: Negative.   Eyes: Negative.   Respiratory: Positive for cough and shortness of breath.        Wears O2 at home PRN, and has been  using it this weekend  Cardiovascular: Negative.   Gastrointestinal: Positive for abdominal pain. Negative for heartburn, nausea, vomiting, diarrhea, constipation, blood in stool and melena.       She is a bit confused about time but says she had BM either today or yesterday.  Genitourinary: Negative.   Musculoskeletal: Negative.   Skin: Negative.   Neurological: Negative.  Negative for weakness.  Endo/Heme/Allergies: Negative.   Psychiatric/Behavioral:       She  is in fairly acute distress with chills, shaking and significant abdominal pain, so I did not go into this.   Blood pressure 126/100, pulse 118, temperature 97.5 F (36.4 C), temperature source Oral, resp. rate 25, SpO2 93 %. Physical Exam  Constitutional:  Acutely ill frail woman with chills and mottling.  Significant abdominal pain.  HENT:  Head: Normocephalic and atraumatic.  Nose: Nose normal.  Eyes: Conjunctivae and EOM are normal. Right eye exhibits no discharge. Left eye exhibits no discharge. No scleral icterus.  Bilateral arcus  Neck: No JVD present. No tracheal deviation present. No thyromegaly present.  Cardiovascular: Normal heart sounds.   Sinus tachycardia  I do not feel her distal pulses  Respiratory: Effort normal and breath sounds normal. No respiratory distress. She has no wheezes. She has no rales. She exhibits no tenderness.  Hurts to breath in or turn  GI: She exhibits no distension. There is tenderness (she is diffusely tender, all over, no BS  ). There is rebound and guarding.  She is diffusely tender, no BS, no scars,   Musculoskeletal: She exhibits no edema.  Lymphadenopathy:    She has no cervical adenopathy.  Neurological: She is alert. No cranial nerve deficit.  She is a bit confused right now, and I think it is due to the severity of her illness.  Skin: No rash noted. No erythema. No pallor.  Cold, mottled, shaking with chills,   Psychiatric: She has a normal mood and affect. Her behavior is  normal. Judgment and thought content normal.    Assessment/Plan: Pneumoperitoneum or unknown etiology Sepsis COPD Tobacco use. Hypertension  Plan:  She has been seen by Dr. Janee Morn and we plan to take her to the OR emergently for exploratory laparotomy for her pneumoperitoneum.  CCM will see and be primary after she gets out of surgery.       Lanice Folden 06/14/2015, 9:26 AM

## 2015-06-14 NOTE — Progress Notes (Addendum)
eLink Physician-Brief Progress Note Patient Name: Colleen [name] DOB: 06/23/1951 MRN: [id number]   Date of Service  06/14/2015  HPI/Events of Note    eICU Interventions  Hypocalcemia- repleted hyperK - improving AKI     Intervention Category Intermediate Interventions: Electrolyte abnormality - evaluation and management  [name] V. 06/14/2015, 5:17 PM

## 2015-06-14 NOTE — ED Notes (Signed)
Removed pt's jewelry except for 4 bracelets on left arm-- unable to slide over wrist and super glued clasp. 2 clothing bags with belongings taken to short stay with patient. Dr. Janee MornHompson at bedside.

## 2015-06-14 NOTE — Plan of Care (Signed)
Problem: Respiratory: Goal: Ability to maintain adequate ventilation will improve Outcome: Not Progressing Remains ventilated at this time.

## 2015-06-14 NOTE — Progress Notes (Addendum)
Pt assessed by RT. Pt currently receiving 10mg  Albuterol neb given by EMS prior to arrival. Pt does have crackles on Left, but mostly clear on Right. No SOB noted at this time, pt does not appear to be in any acute distress at this time. Pt reports she does feel a little better after receiving neb tx. Pt placed on 2L [place] after tx to assess SOB. RT will continue to monitor.

## 2015-06-14 NOTE — Progress Notes (Signed)
CRITICAL VALUE ALERT  Critical value received:  Calcium 6.9  Date of notification:  11/12  Time of notification:  1710  Critical value read back:Yes.    Nurse who received alert:  Laverna PeaceShanna Rondell Pardon  MD notified (1st page):  Dr. Vassie LollAlva (black box)  Time of first page:  1712    MD notified (2nd page):  Time of second page:  Responding MD:    Time MD responded:    Tommi EmeryHINTZ, Jesly Hartmann M

## 2015-06-14 NOTE — ED Provider Notes (Signed)
CSN: [id number]     Arrival date & time 06/14/15  4098 History   First MD Initiated Contact with Patient 06/14/15 [phone]     Chief Complaint  Patient presents with  . Shortness of Breath    Colleen [name] is a 64 y.o. female with a history of COPD, smoker, hypertension, and emphysema who presents to the emergency department complaining of shortness of breath and cough ongoing for the past week and worsened in the past 2 days. Patient also reports she's had fevers for the past 2 days. She reports productive cough. She denies hemoptysis. She reports she was diagnosed with pneumonia approximately 1 month ago by her PCP Dr. Juleen China with clarithromycin then levaquin. She reports pain in her chest only with coughing. She reports she's been using her inhalers as prescribed. She reports she has oxygen at home to use as needed and has required to use it recently. The patient denies hemoptysis, abdominal pain, nausea, vomiting, diarrhea, chest pain, palpitations, lightheadedness, dizziness, syncope, rashes or body aches. Prior to arrival the patient received 10 mg of albuterol, 1 mg Atrovent, and 125 mg of Solu-Medrol by EMS. Patient reports she is much improved after treatments. Later patient informs that she has had abdominal pain for 2 days, but she attributed this to coughing. She reports a history of acid reflux and has not been on medications for this.   (Consider location/radiation/quality/duration/timing/severity/associated sxs/prior Treatment) HPI  Past Medical History  Diagnosis Date  . COPD (chronic obstructive pulmonary disease) (HCC)   . Hypertension   . Asthma   . Emphysema/COPD [facility])    Past Surgical History  Procedure Laterality Date  . Abdominal hysterectomy    . Knee surgery      Bilateral  . Rotator cuff repair     No family history on file. Social History  Substance Use Topics  . Smoking status: Current Every Day Smoker -- 0.80 packs/day for 44 years    Types: Cigarettes  .  Smokeless tobacco: Never Used  . Alcohol Use: Yes   OB History    No data available     Review of Systems  Constitutional: Positive for fever and fatigue. Negative for chills.  HENT: Positive for sneezing. Negative for congestion and sore throat.   Eyes: Negative for visual disturbance.  Respiratory: Positive for cough, chest tightness and wheezing. Negative for shortness of breath.   Cardiovascular: Negative for chest pain and palpitations.  Gastrointestinal: Negative for nausea, vomiting, abdominal pain and diarrhea.  Genitourinary: Negative for dysuria, urgency, hematuria and difficulty urinating.  Musculoskeletal: Negative for back pain and neck pain.  Skin: Negative for rash.  Neurological: Negative for syncope, light-headedness and headaches.      Allergies  Codeine  Home Medications   Prior to Admission medications   Medication Sig Start Date End Date Taking? Authorizing Provider  ADVAIR DISKUS 250-50 MCG/DOSE AEPB Inhale 2 puffs into the lungs 2 (two) times daily as needed. 04/05/15  Yes Historical Provider, MD  ALPHAGAN P 0.1 % SOLN Place 1 drop into both eyes 2 (two) times daily. 03/26/15  Yes Historical Provider, MD  BYSTOLIC 5 MG tablet Take 5 mg by mouth daily. 05/12/15  Yes Historical Provider, MD  chlorpheniramine-HYDROcodone (TUSSIONEX) 10-8 MG/5ML SUER Take 5 mLs by mouth as needed. 1 teaspoon as needed for cough 06/04/15  Yes Historical Provider, MD  clarithromycin (BIAXIN XL) 500 MG 24 hr tablet Take 1,000 mg by mouth daily. 06/04/15  Yes Historical Provider, MD  cyclobenzaprine (FLEXERIL)  10 MG tablet Take 10 mg by mouth as needed. Moderate pain 05/27/15  Yes Historical Provider, MD  fluconazole (DIFLUCAN) 200 MG tablet Take 1 tablet by mouth every other day. 06/04/15  Yes Historical Provider, MD  furosemide (LASIX) 40 MG tablet Take 40 mg by mouth as needed. 04/23/15  Yes Historical Provider, MD  latanoprost (XALATAN) 0.005 % ophthalmic solution Place 1 drop into  both eyes. 1 drop in both eyes once daily at bed time 05/21/15  Yes Historical Provider, MD  levofloxacin (LEVAQUIN) 500 MG tablet Take 500 mg by mouth daily. 06/11/15  Yes Historical Provider, MD  meloxicam (MOBIC) 15 MG tablet Take 1 tablet by mouth as needed. Take one tablet by mouth once daily with food for 14 days then take 1 daily as needed. 05/28/15  Yes Historical Provider, MD  metroNIDAZOLE (METROCREAM) 0.75 % cream  06/04/15  Yes Historical Provider, MD  PROAIR HFA 108 (90 BASE) MCG/ACT inhaler Inhale 1-2 puffs into the lungs every 4 (four) hours as needed. 04/08/15  Yes Historical Provider, MD  sertraline (ZOLOFT) 100 MG tablet Take 100 mg by mouth daily. 05/30/15  Yes Historical Provider, MD  zolpidem (AMBIEN) 10 MG tablet Take 1 tablet by mouth at bedtime as needed. 05/15/15  Yes Historical Provider, MD   BP 158/90 mmHg  Pulse 118  Temp(Src) 97.6 F (36.4 C) (Oral)  Resp 18  Ht  (1.676 m)  Wt 108 lb 0.4 oz (49 kg)  BMI 17.44 kg/m2  SpO2 97% Physical Exam  Constitutional: She is oriented to person, place, and time. She appears well-developed and well-nourished. No distress.  Nontoxic appearing. Very frail appearing 64 year old female.  HENT:  Head: Normocephalic and atraumatic.  Right Ear: External ear normal.  Left Ear: External ear normal.  Mouth/Throat: Oropharynx is clear and moist. No oropharyngeal exudate.  Eyes: Conjunctivae are normal. Pupils are equal, round, and reactive to light. Right eye exhibits no discharge. Left eye exhibits no discharge.  Neck: Normal range of motion. Neck supple. No JVD present. No tracheal deviation present.  Cardiovascular: Regular rhythm, normal heart sounds and intact distal pulses.  Exam reveals no gallop and no friction rub.   No murmur heard. Tachycardic with a heart rate of 120.  Bilateral radial and posterior tibialis pulses are intact. Good capillary refill.  Pulmonary/Chest: Effort normal. No respiratory distress. She has no  wheezes. She has no rales. She exhibits tenderness.  Exam performed after breathing treatment by EMS. Diminished lung sounds bilaterally. No wheezing. Oxygen saturation is 100% on 3 L via nasal cannula. No respiratory distress. Respirations are 20.  Chest wall is tender to palpation.  Abdominal: Soft. Bowel sounds are normal. She exhibits no distension. There is tenderness. There is no rebound and no guarding.  Abdomen is soft. Bowel sounds are present. Patient has generalized abdominal tenderness to palpation. No focal tenderness.   Musculoskeletal: She exhibits no edema or tenderness.  No lower extremity edema or tenderness.  Lymphadenopathy:    She has no cervical adenopathy.  Neurological: She is alert and oriented to person, place, and time. Coordination normal.  Skin: Skin is warm and dry. No rash noted. She is not diaphoretic. No erythema. No pallor.  Psychiatric: She has a normal mood and affect. Her behavior is normal.  Nursing note and vitals reviewed.   ED Course  Procedures (including critical care time) Labs Review Labs Reviewed  COMPREHENSIVE METABOLIC PANEL - Abnormal; Notable for the following:    Sodium 130 (*)  Potassium 5.8 (*)    Chloride 98 (*)    CO2 12 (*)    Glucose, Bld 147 (*)    BUN 78 (*)    Creatinine, Ser 3.94 (*)    ALT 13 (*)    GFR calc non Af Amer 11 (*)    GFR calc Af Amer 13 (*)    Anion gap 20 (*)    All other components within normal limits  CBC WITH DIFFERENTIAL/PLATELET - Abnormal; Notable for the following:    WBC 2.7 (*)    Platelets 544 (*)    All other components within normal limits  URINALYSIS, ROUTINE W REFLEX MICROSCOPIC (NOT AT [facility]) - Abnormal; Notable for the following:    Hgb urine dipstick SMALL (*)    Protein, ur 30 (*)    All other components within normal limits  I-STAT CG4 LACTIC ACID, ED - Abnormal; Notable for the following:    Lactic Acid, Venous 4.26 (*)    All other components within normal limits  CULTURE,  BLOOD (ROUTINE X 2)  ANAEROBIC CULTURE  BODY FLUID CULTURE  CULTURE, BLOOD (ROUTINE X 2)  URINE CULTURE  GRAM STAIN  TROPONIN I  BRAIN NATRIURETIC PEPTIDE  INFLUENZA PANEL BY PCR (TYPE A & B, H1N1)  TRIGLYCERIDES  URINE MICROSCOPIC-ADD ON  LACTIC ACID, PLASMA  BLOOD GAS, ARTERIAL  BASIC METABOLIC PANEL  BASIC METABOLIC PANEL  I-STAT CG4 LACTIC ACID, ED  SURGICAL PATHOLOGY    Imaging Review Dg Chest 2 View  06/14/2015  CLINICAL DATA:  Short of breath. Diagnosed with pneumonia 2 weeks ago. History of COPD and hypertension. EXAM: CHEST  2 VIEW COMPARISON:  06/10/2015 FINDINGS: There is pneumoperitoneum below each hemidiaphragm, new from the prior study. Lungs are clear.  No pleural effusion or pneumothorax. Normal heart, mediastinum hila. Partly imaged right shoulder prosthesis appears well aligned and stable. IMPRESSION: 1. Significant pneumoperitoneum, new since the prior chest radiographs. 2. No acute cardiopulmonary disease. Electronically Signed   By: Amie Portlandavid  Ormond M.D.   On: 06/14/2015 08:47   Dg Chest Port 1 View  06/14/2015  CLINICAL DATA:  Acute respiratory failure, intubated EXAM: PORTABLE CHEST 1 VIEW COMPARISON:  06/14/2015 FINDINGS: Endotracheal tube at the thoracic inlet, 10 cm above the carina. Lungs are clear.  No pleural effusion or pneumothorax. The heart is normal in size. Right IJ venous catheter terminates at the cavoatrial junction. Right shoulder arthroplasty. Pneumoperitoneum is less conspicuous on the current study. IMPRESSION: Endotracheal tube at the thoracic inlet, 10 cm above the carina. Right IJ venous catheter terminates at the cavoatrial junction. Electronically Signed   By: Charline BillsSriyesh  Krishnan M.D.   On: 06/14/2015 15:44   I have personally reviewed and evaluated these images and lab results as part of my medical decision-making.   EKG Interpretation None     ED ECG REPORT   Date: 06/14/2015  Rate: 110  Rhythm: sinus tachycardia  QRS Axis: normal   Intervals: normal  ST/T Wave abnormalities: nonspecific ST changes  Conduction Disutrbances:none  Narrative Interpretation:   Old EKG Reviewed: none available  I have personally reviewed the EKG tracing and agree with the computerized printout as noted.  Filed Vitals:   06/14/15 0747 06/14/15 0952 06/14/15 1300  BP: 126/100 176/156 158/90  Pulse: 118 118   Temp: 97.5 F (36.4 C) 98.5 F (36.9 C) 97.6 F (36.4 C)  TempSrc: Oral Rectal Oral  Resp: 25 20 18   Height:   5\' 6"  (1.676 m)  Weight:  108 lb 0.4 oz (49 kg)  SpO2: 93%  97%     MDM   Meds given in ED:  Medications  white petrolatum (VASELINE) gel ( Topical MAR Unhold 06/14/15 1219)  sodium chloride 0.9 % bolus 1,000 mL ( Intravenous MAR Hold 06/14/15 1040)    Followed by  sodium chloride 0.9 % bolus 500 mL ( Intravenous Automatically Held 06/14/15 1045)  ondansetron (ZOFRAN) injection 4 mg (not administered)  midazolam (VERSED) 2 MG/2ML injection (not administered)  piperacillin-tazobactam (ZOSYN) IVPB 2.25 g (2.25 g Intravenous Given 06/14/15 1349)  enoxaparin (LOVENOX) injection 30 mg (not administered)  pantoprazole (PROTONIX) injection 40 mg (40 mg Intravenous Given 06/14/15 1344)  fentaNYL (SUBLIMAZE) injection 100 mcg (not administered)  fentaNYL (SUBLIMAZE) injection 100 mcg (not administered)  propofol (DIPRIVAN) 1000 MG/100ML infusion (25 mcg/kg/min  49 kg Intravenous New Bag/Given 06/14/15 1351)  fentaNYL (SUBLIMAZE) 2,500 mcg in sodium chloride 0.9 % 250 mL (10 mcg/mL) infusion (50 mcg/hr Intravenous New Bag/Given 06/14/15 1455)  sodium bicarbonate 150 mEq in dextrose 5 % 1,000 mL infusion ( Intravenous New Bag/Given 06/14/15 1600)  chlorhexidine gluconate (PERIDEX) 0.12 % solution 15 mL (not administered)  antiseptic oral rinse solution (CORINZ) (7 mLs Mouth Rinse Given 06/14/15 1553)  ipratropium-albuterol (DUONEB) 0.5-2.5 (3) MG/3ML nebulizer solution 3 mL (3 mLs Nebulization Not Given 06/14/15 1445)   sodium chloride 0.9 % bolus 500 mL (500 mLs Intravenous New Bag/Given 06/14/15 0910)  piperacillin-tazobactam (ZOSYN) IVPB 3.375 g (0 g Intravenous Stopped 06/14/15 1039)  fentaNYL (SUBLIMAZE) injection 100 mcg (100 mcg Intravenous Given 06/14/15 1455)    Current Discharge Medication List      Final diagnoses:  Pneumoperitoneum   This  is a 64 y.o. female with a history of COPD, smoker, hypertension, and emphysema who presents to the emergency department complaining of shortness of breath and cough ongoing for the past week and worsened in the past 2 days. Patient also reports she's had fevers for the past 2 days. She reports productive cough. She denies hemoptysis. Initially the patient did not complain of belly pain until I palpated her abdomen. She attributed the pain to her coughing initially. Patient does report history of acid reflux but has not been taking her medications. Abdomen is diffusely tender to palpation. Patient has no audible wheezing on lung auscultation. Her lung sounds are diminished in bilateral bases. She is tachycardic with a heart rate of 120. Oxygen saturation is 100% on 3 L nasal cannula. Chest x-ray indicated significant pneumoperitoneum and no acute cardiopulmonary disease. Code sepsis was immediately called and patient was ordered Zosyn. Patient given fluid bolus. Dr. Janee Morn from general surgery was immediately consulted and reports he'll be by to see the patient shortly. He advised to order a CT abdomen and pelvis with IV contrast only. Throughout the patient's course in the emergency department she began to complain of more abdominal pain. Patient also remained persistently tachycardic. Patient's lactic acid is 4.26. CMP indicates a potassium of 5.8 with a creatinine of 3.94. She has a gap of 20. CBC shows a normal hemoglobin and hematocrit with a white count of 2700.  Surgical PA Will saw patient and plan is to go to the OR.   To OR with Dr. Janee Morn. The  patient is in agreement with plan for OR.   This patient was discussed with and evaluated by Dr. Clarene Duke who agrees with assessment and plan.   CRITICAL CARE Performed by: Lawana Chambers   Total critical care time: 40 minutes  Critical  care time was exclusive of separately billable procedures and treating other patients.  Critical care was necessary to treat or prevent imminent or life-threatening deterioration.  Critical care was time spent personally by me on the following activities: development of treatment plan with patient and/or surrogate as well as nursing, discussions with consultants, evaluation of patient's response to treatment, examination of patient, obtaining history from patient or surrogate, ordering and performing treatments and interventions, ordering and review of laboratory studies, ordering and review of radiographic studies, pulse oximetry and re-evaluation of patient's condition.     Everlene Farrier, PA-C 06/14/15 1657  Laurence Spates, MD 06/15/15 1140

## 2015-06-14 NOTE — Transfer of Care (Signed)
Immediate Anesthesia Transfer of Care Note  Patient: Colleen [name]  Procedure(s) Performed: Procedure(s): EXPLORATORY LAPAROTOMY AND REPAIR OF PERFORATED ULCER. (N/A)  Patient Location: ICU  Anesthesia Type:General  Level of Consciousness: sedated, unresponsive and Patient remains intubated per anesthesia plan  Airway & Oxygen Therapy: Patient remains intubated per anesthesia plan and Patient placed on Ventilator (see vital sign flow sheet for setting)  Post-op Assessment: Report given to RN and Post -op Vital signs reviewed and stable  Post vital signs: Reviewed and stable  Last Vitals:  Filed Vitals:   06/14/15 0952  BP: 176/156  Pulse: 118  Temp: 36.9 C  Resp: 20    Complications: No apparent anesthesia complications

## 2015-06-14 NOTE — Op Note (Signed)
06/14/2015  11:53 AM  PATIENT:  Colleen [name]  64 y.o. female  PRE-OPERATIVE DIAGNOSIS:  free air in abdomen  POST-OPERATIVE DIAGNOSIS:  Perforated gastric ulcer  PROCEDURE:  Procedure(s): EXPLORATORY LAPAROTOMY GASTRIC BIOPSY REPAIR PERFORATED GASTRIC ULCER  SURGEON:  Surgeon(s): [name], MD  ASSISTANTS: none   ANESTHESIA:   general  EBL:  Total I/O In: 3000 [I.V.:3000] Out: 100 [Urine:100]  BLOOD ADMINISTERED:none  DRAINS: (1) Jackson-Pratt drain(s) with closed bulb suction in the EPIGASTRIUM   SPECIMEN:  Biopsy / Limited Resection  DISPOSITION OF SPECIMEN:  PATHOLOGY  COUNTS:  YES  DICTATION: .Dragon Dictation Findings: Perforated gastric ulcer, 1 cm, antrum. Biopsies sent.  Procedure:Colleen [name] presented to the ED with peritonitis and sepsis. CXR showed free air. We were consulted and she was brought emergently for exploratory laparotomy. She received intravenous antibiotics. Informed consent was obtained. She was brought to the operative room and general endotracheal anesthesia was administered by the anesthesia staff. Foley catheter was placed by nursing. Time out procedure was done. Limited midline incision was made. Subcutaneous tissues were dissected down revealing the anterior fascia. This was divided sharply along the midline. Perineal cavity was entered and free air was released. There was green cloudy fluid mostly localized in the epigastric area. This was sent for cultures. Further exploration revealed a 1 cm perforated gastric ulcer in the antrum. There was inflammation in the tissues but no large mass. The abdomen was then irrigated with multiple liters of warm saline. The ulcer was then repaired with a modified [name] was closed with interrupted 2-0 silk sutures placed in a transverse fashion. Suture tails were left long. I then fashioned a tongue of omentum to come up and laid across the closure. This was secured down with sutures. There was no  leakage. Nasogastric tube was positioned correctly in the stomach. The abdomen was further irrigated. 19 [place] drain was placed to exit in the right side of the abdomen. Abdomen was further irrigated and irrigation fluid returned clear. Fascia was closed with 2 lengths of running #1 looped PDS tied in the middle. Subcutaneous tissues were packed with sterile wet to dry. There were no apparent complications. All counts were correct. As planned, the patient was left intubated and brought directly to the medical intensive care unit. Critical care medicine service has been consulted to assist in management of her sepsis and respiratory failure. PATIENT DISPOSITION:  PACU - hemodynamically stable.   Delay start of Pharmacological VTE agent (>24hrs) due to surgical blood loss or risk of bleeding:  no  Violeta GelinasBurke Bethenny Losee, MD, MPH, FACS Pager: [phone]  11/12/201611:53 AM

## 2015-06-14 NOTE — Progress Notes (Signed)
Belongings taken to PACU

## 2015-06-15 DIAGNOSIS — N179 Acute kidney failure, unspecified: Secondary | ICD-10-CM

## 2015-06-15 HISTORY — PX: EXPLORATORY LAPAROTOMY: SUR591

## 2015-06-15 LAB — CBC
HCT: 21.1 % — ABNORMAL LOW (ref 36.0–46.0)
HCT: 21.2 % — ABNORMAL LOW (ref 36.0–46.0)
HEMOGLOBIN: 7.1 g/dL — AB (ref 12.0–15.0)
Hemoglobin: 7 g/dL — ABNORMAL LOW (ref 12.0–15.0)
MCH: 27.8 pg (ref 26.0–34.0)
MCH: 27.4 pg (ref 26.0–34.0)
MCHC: 33.2 g/dL (ref 30.0–36.0)
MCHC: 33.5 g/dL (ref 30.0–36.0)
MCV: 83.7 fL (ref 78.0–100.0)
MCV: 81.9 fL (ref 78.0–100.0)
PLATELETS: 319 10*3/uL (ref 150–400)
Platelets: 314 10*3/uL (ref 150–400)
RBC: 2.52 MIL/uL — ABNORMAL LOW (ref 3.87–5.11)
RBC: 2.59 MIL/uL — AB (ref 3.87–5.11)
RDW: 15.2 % (ref 11.5–15.5)
RDW: 14.8 % (ref 11.5–15.5)
WBC: 10.5 10*3/uL (ref 4.0–10.5)
WBC: 10.3 10*3/uL (ref 4.0–10.5)

## 2015-06-15 LAB — BASIC METABOLIC PANEL
Anion gap: 11 (ref 5–15)
Anion gap: 10 (ref 5–15)
Anion gap: 10 (ref 5–15)
BUN: 61 mg/dL — AB (ref 6–20)
BUN: 58 mg/dL — ABNORMAL HIGH (ref 6–20)
BUN: 51 mg/dL — ABNORMAL HIGH (ref 6–20)
CALCIUM: 6.4 mg/dL — AB (ref 8.9–10.3)
CALCIUM: 6.8 mg/dL — AB (ref 8.9–10.3)
CALCIUM: 6.7 mg/dL — AB (ref 8.9–10.3)
CHLORIDE: 99 mmol/L — AB (ref 101–111)
CHLORIDE: 100 mmol/L — AB (ref 101–111)
CO2: 24 mmol/L (ref 22–32)
CO2: 28 mmol/L (ref 22–32)
CO2: 27 mmol/L (ref 22–32)
CREATININE: 2.75 mg/dL — AB (ref 0.44–1.00)
CREATININE: 2.5 mg/dL — AB (ref 0.44–1.00)
Chloride: 101 mmol/L (ref 101–111)
Creatinine, Ser: 2.95 mg/dL — ABNORMAL HIGH (ref 0.44–1.00)
GFR calc Af Amer: 19 mL/min — ABNORMAL LOW (ref >60)
GFR calc Af Amer: 20 mL/min — ABNORMAL LOW (ref >60)
GFR calc Af Amer: 23 mL/min — ABNORMAL LOW (ref >60)
GFR calc non Af Amer: 17 mL/min — ABNORMAL LOW (ref >60)
GFR calc non Af Amer: 20 mL/min — ABNORMAL LOW (ref >60)
GFR, EST NON AFRICAN AMERICAN: 16 mL/min — AB (ref >60)
GLUCOSE: 152 mg/dL — AB (ref 65–99)
GLUCOSE: 82 mg/dL (ref 65–99)
GLUCOSE: 102 mg/dL — AB (ref 65–99)
Potassium: 4.1 mmol/L (ref 3.5–5.1)
Potassium: 4.2 mmol/L (ref 3.5–5.1)
Potassium: 3.6 mmol/L (ref 3.5–5.1)
Sodium: 136 mmol/L (ref 135–145)
Sodium: 137 mmol/L (ref 135–145)
Sodium: 137 mmol/L (ref 135–145)

## 2015-06-15 LAB — GLUCOSE, CAPILLARY
GLUCOSE-CAPILLARY: 71 mg/dL (ref 65–99)
GLUCOSE-CAPILLARY: 105 mg/dL — AB (ref 65–99)
GLUCOSE-CAPILLARY: 66 mg/dL (ref 65–99)
GLUCOSE-CAPILLARY: 51 mg/dL — AB (ref 65–99)
Glucose-Capillary: 152 mg/dL — ABNORMAL HIGH (ref 65–99)
Glucose-Capillary: 156 mg/dL — ABNORMAL HIGH (ref 65–99)
Glucose-Capillary: 97 mg/dL (ref 65–99)
Glucose-Capillary: 98 mg/dL (ref 65–99)

## 2015-06-15 LAB — BLOOD GAS, ARTERIAL
ACID-BASE DEFICIT: 3 mmol/L — AB (ref 0.0–2.0)
BICARBONATE: 20.9 meq/L (ref 20.0–24.0)
Drawn by: 236041
FIO2: 0.3
LHR: 14 {breaths}/min
O2 SAT: 99.8 %
PATIENT TEMPERATURE: 98.6
PCO2 ART: 33.7 mmHg — AB (ref 35.0–45.0)
PEEP/CPAP: 5 cmH2O
PH ART: 7.409 (ref 7.350–7.450)
TCO2: 21.9 mmol/L (ref 0–100)
VT: 470 mL
pO2, Arterial: 344 mmHg — ABNORMAL HIGH (ref 80.0–100.0)

## 2015-06-15 LAB — ABO/RH: ABO/RH(D): A POS

## 2015-06-15 MED ORDER — DEXTROSE 50 % IV SOLN
INTRAVENOUS | Status: AC
  Administered 2015-06-15: 50 mL via INTRAVENOUS
  Filled 2015-06-15: qty 50

## 2015-06-15 MED ORDER — DEXTROSE 50 % IV SOLN
25.0000 mL | Freq: Once | INTRAVENOUS | Status: AC
  Administered 2015-06-15: 25 mL via INTRAVENOUS

## 2015-06-15 MED ORDER — INSULIN ASPART 100 UNIT/ML ~~LOC~~ SOLN
0.0000 [IU] | SUBCUTANEOUS | Status: DC
  Administered 2015-06-15: 2 [IU] via SUBCUTANEOUS
  Administered 2015-06-18: 1 [IU] via SUBCUTANEOUS

## 2015-06-15 MED ORDER — DEXTROSE 50 % IV SOLN
25.0000 mL | Freq: Once | INTRAVENOUS | Status: AC
  Administered 2015-06-15: 25 mL via INTRAVENOUS
  Filled 2015-06-15: qty 50

## 2015-06-15 MED ORDER — PNEUMOCOCCAL VAC POLYVALENT 25 MCG/0.5ML IJ INJ: 0.5000 mL | INJECTION | INTRAMUSCULAR | Status: DC | PRN

## 2015-06-15 MED ORDER — INFLUENZA VAC SPLIT QUAD 0.5 ML IM SUSY: 0.5000 mL | PREFILLED_SYRINGE | INTRAMUSCULAR | Status: DC | PRN

## 2015-06-15 MED ORDER — SODIUM CHLORIDE 0.9 % IV SOLN
2.0000 g | Freq: Once | INTRAVENOUS | Status: AC
  Administered 2015-06-15: 2 g via INTRAVENOUS
  Filled 2015-06-15: qty 20

## 2015-06-15 MED ORDER — SODIUM CHLORIDE 0.9 % IV SOLN
INTRAVENOUS | Status: DC
  Administered 2015-06-15: 10:00:00 via INTRAVENOUS

## 2015-06-15 MED ORDER — DEXTROSE 50 % IV SOLN
1.0000 | Freq: Once | INTRAVENOUS | Status: AC
  Administered 2015-06-15: 50 mL via INTRAVENOUS

## 2015-06-15 MED ORDER — PHENOL 1.4 % MT LIQD
1.0000 | OROMUCOSAL | Status: DC | PRN
  Administered 2015-06-15 – 2015-06-18 (×3): 1 via OROMUCOSAL
  Filled 2015-06-15 (×2): qty 177

## 2015-06-15 MED ORDER — DEXTROSE-NACL 5-0.45 % IV SOLN
INTRAVENOUS | Status: DC
  Administered 2015-06-15 – 2015-06-16 (×2): 125 mL via INTRAVENOUS
  Administered 2015-06-16 – 2015-06-19 (×4): via INTRAVENOUS

## 2015-06-15 MED ORDER — DEXTROSE 50 % IV SOLN
INTRAVENOUS | Status: AC
  Filled 2015-06-15: qty 50

## 2015-06-15 MED ORDER — FLUCONAZOLE IN SODIUM CHLORIDE 100-0.9 MG/50ML-% IV SOLN
100.0000 mg | INTRAVENOUS | Status: AC
  Administered 2015-06-15 – 2015-06-19 (×5): 100 mg via INTRAVENOUS
  Filled 2015-06-15 (×12): qty 50

## 2015-06-15 NOTE — Progress Notes (Signed)
eLink Physician-Brief Progress Note Patient Name: Colleen [name] DOB: 11[name]/12/1950 MRN: [id number]   Date of Service  06/15/2015  HPI/Events of Note  Not on maint fluids/ requiring boluses for hypoglycemia/     eICU Interventions  Started d51/2 ns at 125 cc per hour Check cpf      Intervention Category Major Interventions: Hypovolemia - evaluation and treatment with fluids  Sandrea HughsMichael Wert 06/15/2015, 7:30 PM

## 2015-06-15 NOTE — Progress Notes (Signed)
CRITICAL VALUE ALERT  Critical value received:  Ca 6.4  Date of notification:  06/15/15  Time of notification:  0550  Critical value read back: yes  Nurse who received alert:  A. Lamar BenesHaggard RN   MD notified (1st page):  Dr. Arsenio LoaderSommer  Order received for 2 gm Calcium Gluconate  IV

## 2015-06-15 NOTE — Consult Note (Signed)
PULMONARY / CRITICAL CARE MEDICINE   Name: Colleen StarksYasmin H Stewart MRN: [id number] DOB: 03/23/1951    ADMISSION DATE:  06/14/2015 CONSULTATION DATE:  11/12  REFERRING MD :  Janee Mornhompson  CHIEF COMPLAINT:  Vent management and critical care   INITIAL PRESENTATION:  64 year old female w/ h/o COPD, recently treated by PCP for PNA. Admitted on 11/12 w/ acute abd pain and dyspnea. Dx eval demonstrated pneumoperitoneum. Went for emergent exploratory lap. Found ruptured gastric ulcer. This was repaired, biopsies were obtained and JP tube placed. She returned to the ICU on the vent. PCCM asked to assume primary care.   STUDIES:    SIGNIFICANT EVENTS:    HISTORY OF PRESENT ILLNESS:    [age] yof who called EMS on 11/12 with SOB and cough for a week, worse in the last 2 day. New fevers for 2 days. She says she called because it hurt so much when she coughed. No nausea, no vomiting, ate on 11/11 mid day, nothing since.She was treated for pneumonia a month ago by her PCP. She was seen on 11/11 and started on something for possible recurrent pneumonia at that time too. She has h/o severe COPD, uses inhalers and O2 at home. She got solumedrol, Atrovent and albuterol by EMS and was transported to ED. Here is afebrile, tachycardic, WBC 2.7, lactate 4.26, and preliminary CXR shows Significant pneumoperitoneum, new since the prior chest radiographs. Lungs were clear. On surgical team arrival she was  cold, mottled, with diffuse peritonitis, core temp is 98 but she is shaking uncontrollably in ED. She was brought to the OR emergently for exploration. She was found to have perforated gastric ulcer. This was repaired, biopsies were sent and JP drain placed in the epigastrium. She was sent back to the SICU post-op on the vent. PCCM asked to assume care.   SUBJECTIVE: Awake and interactive, following commands, wants ETT out.   VITAL SIGNS: Temp:  [97.5 F (36.4 C)-98.9 F (37.2 C)] 98.8 F (37.1 C) (11/13 0822) Pulse  Rate:  [76-131] 95 (11/13 1000) Resp:  [11-32] 30 (11/13 1000) BP: (91-172)/(59-107) 113/66 mmHg (11/13 1000) SpO2:  [94 %-100 %] 100 % (11/13 1000) FiO2 (%):  [30 %-100 %] 30 % (11/13 0822) Weight:  [46.9 kg (103 lb 6.3 oz)-49 kg (108 lb 0.4 oz)] 46.9 kg (103 lb 6.3 oz) (11/13 0400)   HEMODYNAMICS:   VENTILATOR SETTINGS: Vent Mode:  [-] CPAP;PSV FiO2 (%):  [30 %-100 %] 30 % Set Rate:  [14 bmp-18 bmp] 14 bmp Vt Set:  [470 mL] 470 mL PEEP:  [5 cmH20] 5 cmH20 Pressure Support:  [5 cmH20-10 cmH20] 10 cmH20 Plateau Pressure:  [11 cmH20-19 cmH20] 19 cmH20 INTAKE / OUTPUT:  Intake/Output Summary (Last 24 hours) at 06/15/15 1050 Last data filed at 06/15/15 1000  Gross per 24 hour  Intake 6922.18 ml  Output   1815 ml  Net 5107.18 ml   PHYSICAL EXAMINATION: General:  Awake and interactive. No distress.  Neuro:  Awake and interactive, moving all ext to command. HEENT:  /AT, no JVD orally intubated. Cardiovascular:  Tachy rrr no MRG  Lungs:  Clear, no wheeze, no accessory muscle use  Abdomen:  Mid abd dressing intact, hypoactive BC, NGT to LIWS w/ bilious drainage. JP charged w/ bilious drainage  Musculoskeletal:  Good tone, equal st  Skin:  Warm and dry, no edema   LABS:  CBC  Recent Labs Lab 06/14/15 0823 06/15/15 0512  WBC 2.7* 10.5  HGB 12.4 7.0*  HCT 37.8 21.1*  PLT 544* 314   Coag's No results for input(s): APTT, INR in the last 168 hours. BMET  Recent Labs Lab 06/14/15 1540 06/14/15 2240 06/15/15 0512  NA 135 137 136  K 5.7* 5.0 4.1  CL 111 108 101  CO2 16* 18* 24  BUN 65* 63* 61*  CREATININE 3.26* 3.15* 2.95*  GLUCOSE 158* 188* 152*   Electrolytes  Recent Labs Lab 06/14/15 1540 06/14/15 2240 06/15/15 0512  CALCIUM 6.9* 7.2* 6.4*   Sepsis Markers  Recent Labs Lab 06/14/15 0919 06/14/15 1540  LATICACIDVEN 4.26* 1.3   ABG  Recent Labs Lab 06/14/15 1830 06/15/15 0335  PHART 7.242* 7.409  PCO2ART 30.8* 33.7*  PO2ART 232* 344*    Liver Enzymes  Recent Labs Lab 06/14/15 0823  AST 27  ALT 13*  ALKPHOS 77  BILITOT 0.4  ALBUMIN 3.7   Cardiac Enzymes  Recent Labs Lab 06/14/15 0823  TROPONINI <0.03   Glucose  Recent Labs Lab 06/14/15 1300 06/14/15 1704 06/14/15 1947 06/14/15 2356 06/15/15 0339  GLUCAP 88 97 139* 156* 152*    Imaging Dg Chest Port 1 View  06/14/2015  CLINICAL DATA:  Status post repositioning of endotracheal tube today. EXAM: PORTABLE CHEST 1 VIEW COMPARISON:  Single view of the chest earlier this same day. FINDINGS: Endotracheal tube is in place with the tip 2.0 cm above the carina. Right IJ catheter and OG tube are again seen. The lungs are clear. No pneumothorax or pleural effusion. Heart size normal. IMPRESSION: Tip of endotracheal tube projects 2 cm above the carina. No other change. Electronically Signed   By: Drusilla Kanner M.D.   On: 06/14/2015 18:49   Dg Chest Port 1 View  06/14/2015  CLINICAL DATA:  Acute respiratory failure, intubated EXAM: PORTABLE CHEST 1 VIEW COMPARISON:  06/14/2015 FINDINGS: Endotracheal tube at the thoracic inlet, 10 cm above the carina. Lungs are clear.  No pleural effusion or pneumothorax. The heart is normal in size. Right IJ venous catheter terminates at the cavoatrial junction. Right shoulder arthroplasty. Pneumoperitoneum is less conspicuous on the current study. IMPRESSION: Endotracheal tube at the thoracic inlet, 10 cm above the carina. Right IJ venous catheter terminates at the cavoatrial junction. Electronically Signed   By: Charline Bills M.D.   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 Scheduled BDs Titrate O2 for sat of 88-92%.  CARDIOVASCULAR CVL A:  Sepsis  HTN P:  D/C bicarb drip. Tele  Avoid hypotension   RENAL A:   AKI Hyperkalemia Mixed + AG and NAG metabolic acidosis  Mild hyponatremia  P:   D/C  bicarb drip. BMET daily. Replace electrolytes as indicated. Renal dose meds Strict I&O  GASTROINTESTINAL A:   Perf gastric ulcer s/p surgical repair Peritonitis  P:   NPO per surgery. BID PPI Diet per surgery ? Trickle feeds in next 24-48 hrs OR TNA but will defer to surgery.  HEMATOLOGIC A:   No acute  P:  Trend CBC Transfuse for ICU protocol  LMWH per surgical team   INFECTIOUS A:   Acute peritonitis  P:   Bcx2 11/12>>> Surgical culture 11/12>>> Zosyn 11/12>>>  ENDOCRINE A:   Mild hyperglycemia  P:   CBGs q 4 Start SSI if consistently > 150   NEUROLOGIC A:   Post-op pain  P:   D/C sedation. PRN fentanyl for pain.  FAMILY  - Updates: No family bedside, patient updated.  - Inter-disciplinary family meet or Palliative Care meeting due by:  11/19  The patient is critically ill with multiple organ systems failure and requires high complexity decision making for assessment and support, frequent evaluation and titration of therapies, application of advanced monitoring technologies and extensive interpretation of multiple databases.   Critical Care Time devoted to patient care services described in this note is  35  Minutes. This time reflects time of care of this signee Dr Koren Bound. This critical care time does not reflect procedure time, or teaching time or supervisory time of PA/NP/Med student/Med Resident etc but could involve care discussion time.  Alyson Reedy, M.D. [facility]. Pager: [phone]. After hours pager: [phone].  06/15/2015, 10:50 AM

## 2015-06-15 NOTE — Progress Notes (Signed)
Wasted 25 cc fentanyl in sink witnessed by American International GroupJessica RN.

## 2015-06-15 NOTE — Progress Notes (Signed)
1 Day Post-Op  Subjective: Pt doing well. No acute changes.  Objective: Vital signs in last 24 hours: Temp:  [97.5 F (36.4 C)-98.9 F (37.2 C)] 98.4 F (36.9 C) (11/13 0400) Pulse Rate:  [76-131] 90 (11/13 0700) Resp:  [11-30] 15 (11/13 0700) BP: (91-176)/(59-156) 104/62 mmHg (11/13 0700) SpO2:  [94 %-100 %] 100 % (11/13 0700) FiO2 (%):  [30 %-100 %] 30 % (11/13 0600) Weight:  [46.9 kg (103 lb 6.3 oz)-49 kg (108 lb 0.4 oz)] 46.9 kg (103 lb 6.3 oz) (11/13 0400)    Intake/Output from previous day: 11/12 0701 - 11/13 0700 In: 6379.5 [I.V.:5939.5; NG/GT:60; IV Piggyback:380] Out: 1600 [Urine:1230; Emesis/NG output:150; Drains:145; Blood:75] Intake/Output this shift:    General appearance: alert and cooperative Resp: clear to auscultation bilaterally Cardio: regular rate and rhythm, S1, S2 normal, no murmur, click, rub or gallop GI: soft, dressing in place, approp ttp  Lab Results:   Recent Labs  06/14/15 0823 06/15/15 0512  WBC 2.7* 10.5  HGB 12.4 7.0*  HCT 37.8 21.1*  PLT 544* 314   BMET  Recent Labs  06/14/15 2240 06/15/15 0512  NA 137 136  K 5.0 4.1  CL 108 101  CO2 18* 24  GLUCOSE 188* 152*  BUN 63* 61*  CREATININE 3.15* 2.95*  CALCIUM 7.2* 6.4*   PT/INR No results for input(s): LABPROT, INR in the last 72 hours. ABG  Recent Labs  06/14/15 1830 06/15/15 0335  PHART 7.242* 7.409  HCO3 12.8* 20.9    Studies/Results: Dg Chest 2 View  06/14/2015  CLINICAL DATA:  Short of breath. Diagnosed with pneumonia 2 weeks ago. History of COPD and hypertension. EXAM: CHEST  2 VIEW COMPARISON:  06/10/2015 FINDINGS: There is pneumoperitoneum below each hemidiaphragm, new from the prior study. Lungs are clear.  No pleural effusion or pneumothorax. Normal heart, mediastinum hila. Partly imaged right shoulder prosthesis appears well aligned and stable. IMPRESSION: 1. Significant pneumoperitoneum, new since the prior chest radiographs. 2. No acute cardiopulmonary  disease. Electronically Signed   By: Amie Portland M.D.   On: 06/14/2015 08:47   Dg Chest Port 1 View  06/14/2015  CLINICAL DATA:  Status post repositioning of endotracheal tube today. EXAM: PORTABLE CHEST 1 VIEW COMPARISON:  Single view of the chest earlier this same day. FINDINGS: Endotracheal tube is in place with the tip 2.0 cm above the carina. Right IJ catheter and OG tube are again seen. The lungs are clear. No pneumothorax or pleural effusion. Heart size normal. IMPRESSION: Tip of endotracheal tube projects 2 cm above the carina. No other change. Electronically Signed   By: Drusilla Kanner M.D.   On: 06/14/2015 18:49   Dg Chest Port 1 View  06/14/2015  CLINICAL DATA:  Acute respiratory failure, intubated EXAM: PORTABLE CHEST 1 VIEW COMPARISON:  06/14/2015 FINDINGS: Endotracheal tube at the thoracic inlet, 10 cm above the carina. Lungs are clear.  No pleural effusion or pneumothorax. The heart is normal in size. Right IJ venous catheter terminates at the cavoatrial junction. Right shoulder arthroplasty. Pneumoperitoneum is less conspicuous on the current study. IMPRESSION: Endotracheal tube at the thoracic inlet, 10 cm above the carina. Right IJ venous catheter terminates at the cavoatrial junction. Electronically Signed   By: Charline Bills M.D.   On: 06/14/2015 15:44    Anti-infectives: Anti-infectives    Start     Dose/Rate Route Frequency Ordered Stop   06/14/15 1600  [MAR Hold]  piperacillin-tazobactam (ZOSYN) IVPB 3.375 g  Status:  Discontinued     (  MAR Hold since 06/14/15 1040)   3.375 g 12.5 mL/hr over 240 Minutes Intravenous Every 8 hours 06/14/15 0907 06/14/15 1122   06/14/15 1400  piperacillin-tazobactam (ZOSYN) IVPB 2.25 g     2.25 g 100 mL/hr over 30 Minutes Intravenous 3 times per day 06/14/15 1122     06/14/15 0900  piperacillin-tazobactam (ZOSYN) IVPB 3.375 g     3.375 g 100 mL/hr over 30 Minutes Intravenous  Once 06/14/15 0857 06/14/15 1039       Assessment/Plan: s/p Procedure(s): EXPLORATORY LAPAROTOMY AND REPAIR OF PERFORATED ULCER. (N/A) 1. Con't abx, will add Diflucan 2. CCM managing Vent 3. UOP clearing up and Cr decreasing 4. Hgb low, pt likely equilibrating 2/2 to IVF resus, as min blood loss in OR.  5. Con't NGT    LOS: 1 day    Marigene EhlersRamirez Jr., [facility] 06/15/2015

## 2015-06-15 NOTE — Progress Notes (Signed)
eLink Physician-Brief Progress Note Patient Name: Colleen StarksYasmin H [name] DOB: 11/01/1950 MRN: [id number]   Date of Service  06/15/2015  HPI/Events of Note  Ca++ = 6.4. Last albumin - 3.7.  eICU Interventions  Will replete Ca++.     Intervention Category Intermediate Interventions: Electrolyte abnormality - evaluation and management  Sommer,Steven Eugene 06/15/2015, 5:54 AM

## 2015-06-15 NOTE — Plan of Care (Signed)
CBG 66, d50% 25 cc given IV, for repeat CBG

## 2015-06-15 NOTE — Progress Notes (Signed)
PT demonstrated understanding of both the Flutter and IS- RN aware.

## 2015-06-15 NOTE — Plan of Care (Signed)
Repeat CBG 104

## 2015-06-15 NOTE — Progress Notes (Signed)
Dr. Janee Mornhompson made aware of Hbg of 7.0 this morning, and BPs overnight. Will send type and screen. Will continue to closely monitor. Thresa RossA. Haggard RN

## 2015-06-15 NOTE — Procedures (Signed)
Extubation Procedure Note  Patient Details:   Name: Colleen [name] DOB: 12/07/1950 MRN: [id number]   Airway Documentation:  Airway 7.5 mm (Active)  Secured at (cm) 23 cm 06/15/2015 10:59 AM  Measured From Lips 06/15/2015 10:59 AM  Secured Location Right 06/15/2015  3:25 AM  Secured By [employer] Tube Holder 06/15/2015 10:59 AM  Tube Holder Repositioned Yes 06/15/2015 10:59 AM    Evaluation  O2 sats: stable throughout Complications: No apparent complications Patient did tolerate procedure well. Bilateral Breath Sounds: Clear, Diminished   Yes  Dairl PonderWalters, Shatira Dobosz Nannette 06/15/2015, 11:22 AM

## 2015-06-15 NOTE — Progress Notes (Signed)
Unopened bag of Fentanyl returned to pharmacy.

## 2015-06-16 ENCOUNTER — Encounter (HOSPITAL_COMMUNITY): Payer: Self-pay | Admitting: General Surgery

## 2015-06-16 ENCOUNTER — Inpatient Hospital Stay (HOSPITAL_COMMUNITY): Payer: PRIVATE HEALTH INSURANCE

## 2015-06-16 LAB — CBC
HEMATOCRIT: 20.2 % — AB (ref 36.0–46.0)
Hemoglobin: 6.7 g/dL — CL (ref 12.0–15.0)
MCH: 27.3 pg (ref 26.0–34.0)
MCHC: 33.2 g/dL (ref 30.0–36.0)
MCV: 82.4 fL (ref 78.0–100.0)
Platelets: 338 10*3/uL (ref 150–400)
RBC: 2.45 MIL/uL — ABNORMAL LOW (ref 3.87–5.11)
RDW: 15.1 % (ref 11.5–15.5)
WBC: 10.9 10*3/uL — AB (ref 4.0–10.5)

## 2015-06-16 LAB — BASIC METABOLIC PANEL
ANION GAP: 11 (ref 5–15)
BUN: 47 mg/dL — AB (ref 6–20)
CALCIUM: 6.8 mg/dL — AB (ref 8.9–10.3)
CO2: 26 mmol/L (ref 22–32)
Chloride: 101 mmol/L (ref 101–111)
Creatinine, Ser: 2.27 mg/dL — ABNORMAL HIGH (ref 0.44–1.00)
GFR calc Af Amer: 25 mL/min — ABNORMAL LOW (ref >60)
GFR calc non Af Amer: 22 mL/min — ABNORMAL LOW (ref >60)
GLUCOSE: 98 mg/dL (ref 65–99)
POTASSIUM: 3.6 mmol/L (ref 3.5–5.1)
Sodium: 138 mmol/L (ref 135–145)

## 2015-06-16 LAB — GLUCOSE, CAPILLARY
GLUCOSE-CAPILLARY: 100 mg/dL — AB (ref 65–99)
GLUCOSE-CAPILLARY: 132 mg/dL — AB (ref 65–99)
GLUCOSE-CAPILLARY: 76 mg/dL (ref 65–99)
GLUCOSE-CAPILLARY: 86 mg/dL (ref 65–99)
GLUCOSE-CAPILLARY: 88 mg/dL (ref 65–99)
Glucose-Capillary: 191 mg/dL — ABNORMAL HIGH (ref 65–99)
Glucose-Capillary: 62 mg/dL — ABNORMAL LOW (ref 65–99)
Glucose-Capillary: 67 mg/dL (ref 65–99)
Glucose-Capillary: 88 mg/dL (ref 65–99)
Glucose-Capillary: 90 mg/dL (ref 65–99)

## 2015-06-16 LAB — PREPARE RBC (CROSSMATCH)

## 2015-06-16 LAB — URINE CULTURE: CULTURE: NO GROWTH

## 2015-06-16 LAB — PHOSPHORUS: Phosphorus: 5.6 mg/dL — ABNORMAL HIGH (ref 2.5–4.6)

## 2015-06-16 LAB — MAGNESIUM: Magnesium: 1.2 mg/dL — ABNORMAL LOW (ref 1.7–2.4)

## 2015-06-16 MED ORDER — IPRATROPIUM-ALBUTEROL 0.5-2.5 (3) MG/3ML IN SOLN
3.0000 mL | Freq: Two times a day (BID) | RESPIRATORY_TRACT | Status: DC
  Administered 2015-06-17 – 2015-06-19 (×5): 3 mL via RESPIRATORY_TRACT
  Filled 2015-06-16 (×7): qty 3

## 2015-06-16 MED ORDER — NALOXONE HCL 0.4 MG/ML IJ SOLN: 0.4000 mg | INTRAMUSCULAR | Status: DC | PRN

## 2015-06-16 MED ORDER — DEXTROSE 50 % IV SOLN
INTRAVENOUS | Status: AC
  Administered 2015-06-16: 25 mL
  Filled 2015-06-16: qty 50

## 2015-06-16 MED ORDER — DIPHENHYDRAMINE HCL 12.5 MG/5ML PO ELIX
12.5000 mg | ORAL_SOLUTION | Freq: Four times a day (QID) | ORAL | Status: DC | PRN
Start: 2015-06-16 — End: 2015-06-18

## 2015-06-16 MED ORDER — DIPHENHYDRAMINE HCL 50 MG/ML IJ SOLN: 12.5000 mg | Freq: Four times a day (QID) | INTRAMUSCULAR | Status: DC | PRN

## 2015-06-16 MED ORDER — BUDESONIDE 0.25 MG/2ML IN SUSP
0.2500 mg | Freq: Two times a day (BID) | RESPIRATORY_TRACT | Status: DC
  Administered 2015-06-16 – 2015-06-19 (×7): 0.25 mg via RESPIRATORY_TRACT
  Filled 2015-06-16 (×9): qty 2

## 2015-06-16 MED ORDER — MAGNESIUM SULFATE 2 GM/50ML IV SOLN
2.0000 g | Freq: Once | INTRAVENOUS | Status: AC
  Administered 2015-06-16: 2 g via INTRAVENOUS
  Filled 2015-06-16: qty 50

## 2015-06-16 MED ORDER — SODIUM CHLORIDE 0.9 % IV SOLN: Freq: Once | INTRAVENOUS | Status: DC

## 2015-06-16 MED ORDER — HYDROMORPHONE 1 MG/ML IV SOLN
INTRAVENOUS | Status: DC
  Administered 2015-06-16: 1.2 mg via INTRAVENOUS
  Administered 2015-06-16: 0.6 mg via INTRAVENOUS
  Administered 2015-06-16: 0.9 mg via INTRAVENOUS
  Administered 2015-06-16: 06:00:00 via INTRAVENOUS
  Administered 2015-06-16: 1.5 mg via INTRAVENOUS
  Administered 2015-06-17 (×2): 0.3 mg via INTRAVENOUS
  Administered 2015-06-17: 1.5 mg via INTRAVENOUS
  Administered 2015-06-17 (×2): 0.9 mg via INTRAVENOUS
  Administered 2015-06-17: 1.2 mg via INTRAVENOUS
  Administered 2015-06-18: 0.6 mg via INTRAVENOUS
  Administered 2015-06-18 (×2): 0 mg via INTRAVENOUS
  Administered 2015-06-18: 0.3 mg via INTRAVENOUS
  Filled 2015-06-16: qty 25

## 2015-06-16 MED ORDER — SODIUM CHLORIDE 0.9 % IJ SOLN: 9.0000 mL | INTRAMUSCULAR | Status: DC | PRN

## 2015-06-16 MED ORDER — ONDANSETRON HCL 4 MG/2ML IJ SOLN
4.0000 mg | Freq: Four times a day (QID) | INTRAMUSCULAR | Status: DC | PRN
Start: 2015-06-16 — End: 2015-06-18

## 2015-06-16 NOTE — Progress Notes (Signed)
Codeine is listed as an allergy. Ms. Fulford states that itching with Codeine is mild. Clarified that Dilaudid PCA ok to administer with pharmacy. Thresa RossA. Max Romano RN

## 2015-06-16 NOTE — Evaluation (Signed)
Physical Therapy Evaluation Patient Details Name: Colleen [name] MRN: [id number] DOB: April 11, 1951 Today's Date: 06/16/2015   History of Present Illness  pt is a 64 y/o female with h/o COPD, admitted with acute abd pain and dyspnea due to pneumoperitoneum, s/p emergent exploratory lap for ruptured gastric ulcer.  Clinical Impression  Pt admitted with/for abd pain s/p exp lap with repair of gastry rupture.  Pt currently limited functionally due to the problems listed. ( See problems list.)   Pt will benefit from PT to maximize function and safety in order to get ready for next venue listed below.     Follow Up Recommendations SNF    Equipment Recommendations  Other (comment) (TBA)    Recommendations for Other Services       Precautions / Restrictions Precautions Precautions: Fall      Mobility  Bed Mobility Overal bed mobility: Needs Assistance Bed Mobility: Supine to Sit     Supine to sit: Min assist        Transfers Overall transfer level: Needs assistance   Transfers: Sit to/from Stand Sit to Stand: Min assist         General transfer comment: cues for hand placement  Ambulation/Gait Ambulation/Gait assistance: Min assist Ambulation Distance (Feet): 150 Feet Assistive device:  (pushing w/c) Gait Pattern/deviations: Step-through pattern Gait velocity: slower Gait velocity interpretation: Below normal speed for age/gender General Gait Details: short tentative steps than improved mildly with distance.  Stairs            Wheelchair Mobility    Modified Rankin (Stroke Patients Only)       Balance Overall balance assessment: Needs assistance Sitting-balance support: No upper extremity supported Sitting balance-Leahy Scale: Fair     Standing balance support: No upper extremity supported;Single extremity supported Standing balance-Leahy Scale: Fair                               Pertinent Vitals/Pain Pain Assessment: Faces Faces  Pain Scale: Hurts even more Pain Location: abdominal incision pain Pain Descriptors / Indicators: Aching;Sore;Grimacing Pain Intervention(s): Monitored during session;Repositioned    Home Living Family/patient expects to be discharged to:: Private residence Living Arrangements: Other relatives Available Help at Discharge:  (not much assist in [employer], more PRN assist in [facility]) Type of Home: House Home Access: Stairs to enter Entrance Stairs-Rails: Doctor, general practice of Steps: several Home Layout: 1/2 bath on main level        Prior Function Level of Independence: Independent               Hand Dominance        Extremity/Trunk Assessment   Upper Extremity Assessment: Overall WFL for tasks assessed           Lower Extremity Assessment: Generalized weakness (bil generall weak)         Communication   Communication: No difficulties  Cognition Arousal/Alertness: Awake/alert Behavior During Therapy: WFL for tasks assessed/performed Overall Cognitive Status: Within Functional Limits for tasks assessed                      General Comments      Exercises        Assessment/Plan    PT Assessment Patient needs continued PT services  PT Diagnosis Difficulty walking;Generalized weakness   PT Problem List Decreased strength;Decreased activity tolerance;Decreased balance;Decreased mobility;Pain;Decreased knowledge of use of DME  PT Treatment Interventions Gait training;Stair training;Functional  mobility training;Therapeutic activities;Balance training;Patient/family education   PT Goals (Current goals can be found in the Care Plan section) Acute Rehab PT Goals Patient Stated Goal: back home, packed, [facility] to [facility] PT Goal Formulation: With patient Time For Goal Achievement: 06/30/15 Potential to Achieve Goals: Good    Frequency Min 3X/week   Barriers to discharge        Co-evaluation                End of Session   Activity Tolerance: Patient tolerated treatment well Patient left: in chair;with call bell/phone within reach Nurse Communication: Mobility status         Time: 1430-1459 PT Time Calculation (min) (ACUTE ONLY): 29 min   Charges:   PT Evaluation $PT Re-evaluation: 1 Procedure PT Treatments $Gait Training: 8-22 mins   PT G Codes:        Aimi Essner, Eliseo Gum 06/16/2015, 3:35 PM 06/16/2015  Caspar Bing, PT [phone]  (pager)

## 2015-06-16 NOTE — Progress Notes (Signed)
Patient ID: Colleen [name], female   DOB: 1950/12/29, 64 y.o.   MRN: [id number] 2 Days Post-Op  Subjective: Pt c/o some pain but otherwise controlled.  Not able to sleep.  Objective: Vital signs in last 24 hours: Temp:  [97.9 F (36.6 C)-98.8 F (37.1 C)] 98.7 F (37.1 C) (11/14 0726) Pulse Rate:  [86-113] 102 (11/14 0728) Resp:  [9-32] 23 (11/14 0728) BP: (110-153)/(63-107) 136/78 mmHg (11/14 0728) SpO2:  [97 %-100 %] 98 % (11/14 0728) FiO2 (%):  [30 %] 30 % (11/13 1059)    Intake/Output from previous day: 11/13 0701 - 11/14 0700 In: 2416.9 [I.V.:2186.9; NG/GT:30; IV Piggyback:200] Out: 2375 [Urine:2215; Emesis/NG output:50; Drains:110] Intake/Output this shift:    PE: Abd: soft, midline wound is packed.  JP drain with serous drainage.  NGT with minimal spit-like output, 50cc Heart: regular Lungs: Clear anterior, pulls 1250 on IS  Lab Results:   Recent Labs  06/15/15 1131 06/16/15 0437  WBC 10.3 10.9*  HGB 7.1* 6.7*  HCT 21.2* 20.2*  PLT 319 338   BMET  Recent Labs  06/15/15 2324 06/16/15 0437  NA 137 138  K 3.6 3.6  CL 100* 101  CO2 27 26  GLUCOSE 102* 98  BUN 51* 47*  CREATININE 2.50* 2.27*  CALCIUM 6.7* 6.8*   PT/INR No results for input(s): LABPROT, INR in the last 72 hours. CMP     Component Value Date/Time   NA 138 06/16/2015 0437   K 3.6 06/16/2015 0437   CL 101 06/16/2015 0437   CO2 26 06/16/2015 0437   GLUCOSE 98 06/16/2015 0437   BUN 47* 06/16/2015 0437   CREATININE 2.27* 06/16/2015 0437   CALCIUM 6.8* 06/16/2015 0437   PROT 7.5 06/14/2015 0823   ALBUMIN 3.7 06/14/2015 0823   AST 27 06/14/2015 0823   ALT 13* 06/14/2015 0823   ALKPHOS 77 06/14/2015 0823   BILITOT 0.4 06/14/2015 0823   GFRNONAA 22* 06/16/2015 0437   GFRAA 25* 06/16/2015 0437   Lipase  No results found for: LIPASE     Studies/Results: Dg Chest 2 View  06/14/2015  CLINICAL DATA:  Short of breath. Diagnosed with pneumonia 2 weeks ago. History of COPD and  hypertension. EXAM: CHEST  2 VIEW COMPARISON:  06/10/2015 FINDINGS: There is pneumoperitoneum below each hemidiaphragm, new from the prior study. Lungs are clear.  No pleural effusion or pneumothorax. Normal heart, mediastinum hila. Partly imaged right shoulder prosthesis appears well aligned and stable. IMPRESSION: 1. Significant pneumoperitoneum, new since the prior chest radiographs. 2. No acute cardiopulmonary disease. Electronically Signed   By: Amie Portland M.D.   On: 06/14/2015 08:47   Dg Chest Port 1 View  06/14/2015  CLINICAL DATA:  Status post repositioning of endotracheal tube today. EXAM: PORTABLE CHEST 1 VIEW COMPARISON:  Single view of the chest earlier this same day. FINDINGS: Endotracheal tube is in place with the tip 2.0 cm above the carina. Right IJ catheter and OG tube are again seen. The lungs are clear. No pneumothorax or pleural effusion. Heart size normal. IMPRESSION: Tip of endotracheal tube projects 2 cm above the carina. No other change. Electronically Signed   By: Drusilla Kanner M.D.   On: 06/14/2015 18:49   Dg Chest Port 1 View  06/14/2015  CLINICAL DATA:  Acute respiratory failure, intubated EXAM: PORTABLE CHEST 1 VIEW COMPARISON:  06/14/2015 FINDINGS: Endotracheal tube at the thoracic inlet, 10 cm above the carina. Lungs are clear.  No pleural effusion or pneumothorax. The heart is  normal in size. Right IJ venous catheter terminates at the cavoatrial junction. Right shoulder arthroplasty. Pneumoperitoneum is less conspicuous on the current study. IMPRESSION: Endotracheal tube at the thoracic inlet, 10 cm above the carina. Right IJ venous catheter terminates at the cavoatrial junction. Electronically Signed   By: Charline Bills M.D.   On: 06/14/2015 15:44    Anti-infectives: Anti-infectives    Start     Dose/Rate Route Frequency Ordered Stop   06/15/15 0900  fluconazole (DIFLUCAN) IVPB 100 mg     100 mg 50 mL/hr over 60 Minutes Intravenous Every 24 hours 06/15/15  0755     06/14/15 1600  [MAR Hold]  piperacillin-tazobactam (ZOSYN) IVPB 3.375 g  Status:  Discontinued     (MAR Hold since 06/14/15 1040)   3.375 g 12.5 mL/hr over 240 Minutes Intravenous Every 8 hours 06/14/15 0907 06/14/15 1122   06/14/15 1400  piperacillin-tazobactam (ZOSYN) IVPB 2.25 g     2.25 g 100 mL/hr over 30 Minutes Intravenous 3 times per day 06/14/15 1122     06/14/15 0900  piperacillin-tazobactam (ZOSYN) IVPB 3.375 g     3.375 g 100 mL/hr over 30 Minutes Intravenous  Once 06/14/15 0857 06/14/15 1039       Assessment/Plan  1. POD 2, s/p ex lap with repair of perforated gastric ulcer with graham patch -cont NGT for today despite minimal output, can likely take this out tomorrow -will start mobilization today.  PT eval  -cont pulm toileting -cont PCA for pain control -Zosyn/Diflucan D3 -BID WD dressing changes to midline wound -cont JP drain for now, until eating diet -surgically stable for tx out of ICU  2. ABL anemia -hgb 6.7 today, will transfuse 2 units of pRBCs -check labs in am  3. AKI -Cr down to 2.27.  2215cc of UOP yesterday.  Ok to DC foley from out standpoint, when ok with medicine. 4. DVT proph -SCDs/Lovenox (anemia not related to bleeding but dilutional and h/o anemia.  No need, at this time, to hold lovenox for bleeding concerns)  LOS: 2 days    Lashante Fryberger E 06/16/2015, 7:46 AM Pager: [phone]

## 2015-06-16 NOTE — Progress Notes (Signed)
CRITICAL VALUE ALERT  Critical value received:  Hgb 6.7  Date of notification:  06/16/15  Time of notification:  0522  Critical value read back: yes  Nurse who received alert:  A. Lamar BenesHaggard RN  MD notified (1st page):   Dr. Derrell Lollingamirez made aware of Hbg, vital signs stable. No orders to transfuse at this time. Also made aware of patient's pain. Order received to start Dilaudid Full-Dose PCA. Will continue to closely monitor. Thresa RossA. Pantera Winterrowd RN

## 2015-06-16 NOTE — Progress Notes (Signed)
Utilization Review Completed.  

## 2015-06-16 NOTE — Consult Note (Signed)
PULMONARY / CRITICAL CARE MEDICINE   Name: Colleen [name] MRN: [id number] DOB: [birth year]    ADMISSION DATE:  06/14/2015 CONSULTATION DATE:  11/12  REFERRING MD :  Janee Morn  CHIEF COMPLAINT:  Vent management and critical care   INITIAL PRESENTATION:  64 year old female w/ h/o COPD, recently treated by PCP for PNA. Admitted on 11/12 w/ acute abd pain and dyspnea. Dx eval demonstrated pneumoperitoneum. Went for emergent exploratory lap. Found ruptured gastric ulcer. This was repaired, biopsies were obtained and JP tube placed. She returned to the ICU on the vent. PCCM asked to assume primary care.   STUDIES and SIGNIFICANT EVENTS: 06/15/15 - Awake and interactive, following commands, wants ETT out.    SUBJECTIVE/OVERNIGHT/INTERVAL HX 06/16/15 - watching TV. Using dilayudid PCA. No active bleeding but hgb < 7gm% and CCS has ordered prbc. CCS ok with tx out of ICU per their note. She feels improved but tired from lack of sleep. Hypoglycemia overnigh and on d5 half at 125/cch  VITAL SIGNS: Temp:  [97.9 F (36.6 C)-98.7 F (37.1 C)] 98.7 F (37.1 C) (11/14 0726) Pulse Rate:  [88-113] 102 (11/14 0728) Resp:  [9-32] 23 (11/14 0728) BP: (110-153)/(63-107) 136/78 mmHg (11/14 0728) SpO2:  [97 %-100 %] 98 % (11/14 0728) FiO2 (%):  [30 %] 30 % (11/13 1059)   HEMODYNAMICS: CVP:  [4 mmHg] 4 mmHg VENTILATOR SETTINGS: Vent Mode:  [-] CPAP;PSV FiO2 (%):  [30 %] 30 % PEEP:  [5 cmH20] 5 cmH20 Pressure Support:  [5 cmH20] 5 cmH20 INTAKE / OUTPUT:  Intake/Output Summary (Last 24 hours) at 06/16/15 4098 Last data filed at 06/16/15 0700  Gross per 24 hour  Intake 2245.97 ml  Output   2250 ml  Net  -4.03 ml   PHYSICAL EXAMINATION: General:  Awake and interactive. No distress. DEconditioned Neuro:  Awake and interactive, moving all ext to command. HEENT:  [place]/AT, no JVD orally intubated. O2 nasal cannula on Cardiovascular:  Tachy rrr no MRG  Lungs:  Clear, no wheeze, no accessory muscle  use  Abdomen:  Mid abd dressing intact, hypoactive BC, NGT to LIWS w/ bilious drainage. JP charged w/ bilious drainage  Musculoskeletal:  Good tone, equal st  Skin:  Warm and dry, no edema   LABS: PULMONARY  Recent Labs Lab 06/14/15 1830 06/15/15 0335  PHART 7.242* 7.409  PCO2ART 30.8* 33.7*  PO2ART 232* 344*  HCO3 12.8* 20.9  TCO2 13.7 21.9  O2SAT 99.1 99.8    CBC  Recent Labs Lab 06/15/15 0512 06/15/15 1131 06/16/15 0437  HGB 7.0* 7.1* 6.7*  HCT 21.1* 21.2* 20.2*  WBC 10.5 10.3 10.9*  PLT 314 319 338    COAGULATION No results for input(s): INR in the last 168 hours.  CARDIAC   Recent Labs Lab 06/14/15 0823  TROPONINI <0.03   No results for input(s): PROBNP in the last 168 hours.   CHEMISTRY  Recent Labs Lab 06/14/15 2240 06/15/15 0512 06/15/15 1431 06/15/15 2324 06/16/15 0437  NA 137 136 137 137 138  K 5.0 4.1 4.2 3.6 3.6  CL 108 101 99* 100* 101  CO2 18* GLUCOSE 188* 152* 82 102* 98  BUN 63* 61* 58* 51* 47*  CREATININE 3.15* 2.95* 2.75* 2.50* 2.27*  CALCIUM 7.2* 6.4* 6.8* 6.7* 6.8*  MG  --   --   --   --  1.2*  PHOS  --   --   --   --  5.6*   Estimated  Creatinine Clearance: 19 mL/min (by C-G formula based on Cr of 2.27).   LIVER  Recent Labs Lab 06/14/15 0823  AST 27  ALT 13*  ALKPHOS 77  BILITOT 0.4  PROT 7.5  ALBUMIN 3.7     INFECTIOUS  Recent Labs Lab 06/14/15 0919 06/14/15 1540  LATICACIDVEN 4.26* 1.3     ENDOCRINE CBG (last 3)   Recent Labs  06/16/15 0016 06/16/15 0352 06/16/15 0724  GLUCAP 191* 76 88         IMAGING x48h  - image(s) personally visualized  -   highlighted in bold Dg Chest 2 View  06/14/2015  CLINICAL DATA:  Short of breath. Diagnosed with pneumonia 2 weeks ago. History of COPD and hypertension. EXAM: CHEST  2 VIEW COMPARISON:  06/10/2015 FINDINGS: There is pneumoperitoneum below each hemidiaphragm, new from the prior study. Lungs are clear.  No pleural effusion or  pneumothorax. Normal heart, mediastinum hila. Partly imaged right shoulder prosthesis appears well aligned and stable. IMPRESSION: 1. Significant pneumoperitoneum, new since the prior chest radiographs. 2. No acute cardiopulmonary disease. Electronically Signed   By: Amie Portlandavid  Ormond M.D.   On: 06/14/2015 08:47   Dg Chest Port 1 View  06/14/2015  CLINICAL DATA:  Status post repositioning of endotracheal tube today. EXAM: PORTABLE CHEST 1 VIEW COMPARISON:  Single view of the chest earlier this same day. FINDINGS: Endotracheal tube is in place with the tip 2.0 cm above the carina. Right IJ catheter and OG tube are again seen. The lungs are clear. No pneumothorax or pleural effusion. Heart size normal. IMPRESSION: Tip of endotracheal tube projects 2 cm above the carina. No other change. Electronically Signed   By: Drusilla Kannerhomas  Dalessio M.D.   On: 06/14/2015 18:49   Dg Chest Port 1 View  06/14/2015  CLINICAL DATA:  Acute respiratory failure, intubated EXAM: PORTABLE CHEST 1 VIEW COMPARISON:  06/14/2015 FINDINGS: Endotracheal tube at the thoracic inlet, 10 cm above the carina. Lungs are clear.  No pleural effusion or pneumothorax. The heart is normal in size. Right IJ venous catheter terminates at the cavoatrial junction. Right shoulder arthroplasty. Pneumoperitoneum is less conspicuous on the current study. IMPRESSION: Endotracheal tube at the thoracic inlet, 10 cm above the carina. Right IJ venous catheter terminates at the cavoatrial junction. Electronically Signed   By: Charline BillsSriyesh  Krishnan M.D.   On: 06/14/2015 15:44        ASSESSMENT / PLAN:  PULMONARY OETT A:  H/o COPD  0on O2 - [place] in [place] PCCM in office Currently   - Ventilator dependence after prolonged abd surgery  - s/p successful extubation - on 06/16/2015 - doing well on [place]    P:   scheduled BDs Titrate O2 for sat of 88-92%. OPD fu PCCM  CARDIOVASCULAR CVL A:  Sepsis  HTN   - doing well P:  Avoid  hypotension   RENAL A:   #Baseline creat  - 0.9mg % in 2010. None available after that #At Admit  - ATN 3.94mg % admit 06/14/2015   #Currently - improving to 2.27mg % - Hypomagnesemia +   Recent Labs Lab 06/14/15 2240 06/15/15 0512 06/15/15 1431 06/15/15 2324 06/16/15 0437  CREATININE 3.15* 2.95* 2.75* 2.50* 2.27*      AKI Hyperkalemia Mixed + AG and NAG metabolic acidosis  Mild hyponatremia  P:   D/C bicarb drip. BMET daily. Replace electrolytes as indicated. Renal dose meds Strict I&O  GASTROINTESTINAL A:   Perf gastric ulcer s/p surgical repair Peritonitis    -  still no bowel sounds 06/16/2015 -  P:   NPO per surgery. BID PPI Diet per surgery   HEMATOLOGIC A:   No active bleed 06/16/2015 but has anemia of critial illness P:  1 unit prbc for hgb > 7gm% - (will cut down from 2 units prbc) Trend CBC Transfuse for ICU protocol  LMWH per surgical team - d/w Barnetta Chapel PA 06/16/2015 of [facility] - continue this as of 06/16/2015   INFECTIOUS A:   Acute peritonitis  P:   Bcx2 11/12>>> Surgical culture 11/12>>> Zosyn 11/12>>>  ENDOCRINE A:   Having hypoglycemia 06/16/15  P:   On d5 half normal saline  At 125/cc/h NPO CBGs q 4 Start SSI if consistently > 150   NEUROLOGIC A:   Post-op pain - still ongoing but improved  P:   Dilaudid PCA  FAMILY  - Updates: No family bedside, patient updated.  - Inter-disciplinary family meet or Palliative Care meeting due by:  11/1  GLOBAL Move to med-surg. [facility] wants TRH as primary from 06/17/15 and PCCM will be off.  Dw Dr Sharon Seller as well of [facility]   OPD FU Appointment Future Appointments Date Time Provider Department Center  07/10/2015 3:30 PM Tammy Rogers Se[place], NP [facility] None    Dr. Kalman Shan, M.D., [facility].C.P Pulmonary and Critical Care Medicine Staff Physician [facility] Pager: [phone], If no answer or between  15:00h - 7:00h: call [phone]  06/16/2015 8:45 AM

## 2015-06-17 DIAGNOSIS — J441 Chronic obstructive pulmonary disease with (acute) exacerbation: Secondary | ICD-10-CM

## 2015-06-17 LAB — CBC
HCT: 27.9 % — ABNORMAL LOW (ref 36.0–46.0)
HEMOGLOBIN: 9.6 g/dL — AB (ref 12.0–15.0)
MCH: 27.7 pg (ref 26.0–34.0)
MCHC: 34.4 g/dL (ref 30.0–36.0)
MCV: 80.6 fL (ref 78.0–100.0)
PLATELETS: 301 10*3/uL (ref 150–400)
RBC: 3.46 MIL/uL — AB (ref 3.87–5.11)
RDW: 15.9 % — ABNORMAL HIGH (ref 11.5–15.5)
WBC: 12.3 10*3/uL — AB (ref 4.0–10.5)

## 2015-06-17 LAB — GLUCOSE, CAPILLARY
GLUCOSE-CAPILLARY: 124 mg/dL — AB (ref 65–99)
GLUCOSE-CAPILLARY: 99 mg/dL (ref 65–99)
Glucose-Capillary: 113 mg/dL — ABNORMAL HIGH (ref 65–99)
Glucose-Capillary: 117 mg/dL — ABNORMAL HIGH (ref 65–99)
Glucose-Capillary: 103 mg/dL — ABNORMAL HIGH (ref 65–99)

## 2015-06-17 LAB — MAGNESIUM: MAGNESIUM: 1.4 mg/dL — AB (ref 1.7–2.4)

## 2015-06-17 LAB — BASIC METABOLIC PANEL
ANION GAP: 12 (ref 5–15)
BUN: 22 mg/dL — AB (ref 6–20)
CHLORIDE: 100 mmol/L — AB (ref 101–111)
CO2: 24 mmol/L (ref 22–32)
Calcium: 8.5 mg/dL — ABNORMAL LOW (ref 8.9–10.3)
Creatinine, Ser: 1.5 mg/dL — ABNORMAL HIGH (ref 0.44–1.00)
GFR, EST AFRICAN AMERICAN: 42 mL/min — AB (ref >60)
GFR, EST NON AFRICAN AMERICAN: 36 mL/min — AB (ref >60)
Glucose, Bld: 118 mg/dL — ABNORMAL HIGH (ref 65–99)
POTASSIUM: 3.3 mmol/L — AB (ref 3.5–5.1)
SODIUM: 136 mmol/L (ref 135–145)

## 2015-06-17 LAB — PHOSPHORUS: PHOSPHORUS: 4.2 mg/dL (ref 2.5–4.6)

## 2015-06-17 LAB — H. PYLORI ANTIBODY, IGG

## 2015-06-17 MED ORDER — POTASSIUM CHLORIDE 10 MEQ/100ML IV SOLN
10.0000 meq | INTRAVENOUS | Status: AC
Start: 2015-06-17 — End: 2015-06-17
  Administered 2015-06-17 (×3): 10 meq via INTRAVENOUS
  Filled 2015-06-17: qty 100

## 2015-06-17 MED ORDER — SODIUM CHLORIDE 0.9 % IJ SOLN
10.0000 mL | INTRAMUSCULAR | Status: DC | PRN
  Administered 2015-06-19 – 2015-06-20 (×3): 10 mL
  Filled 2015-06-17 (×3): qty 40

## 2015-06-17 MED ORDER — BRIMONIDINE TARTRATE 0.2 % OP SOLN
1.0000 [drp] | Freq: Two times a day (BID) | OPHTHALMIC | Status: DC
  Administered 2015-06-17 – 2015-06-20 (×7): 1 [drp] via OPHTHALMIC
  Filled 2015-06-17 (×2): qty 5

## 2015-06-17 MED ORDER — ZOLPIDEM TARTRATE 5 MG PO TABS
5.0000 mg | ORAL_TABLET | Freq: Every evening | ORAL | Status: DC | PRN
  Administered 2015-06-17 – 2015-06-19 (×3): 5 mg via ORAL
  Filled 2015-06-17 (×3): qty 1

## 2015-06-17 MED ORDER — ZOLPIDEM TARTRATE 5 MG PO TABS: 5.0000 mg | ORAL_TABLET | Freq: Once | ORAL | Status: DC

## 2015-06-17 MED ORDER — HYDRALAZINE HCL 20 MG/ML IJ SOLN
10.0000 mg | Freq: Once | INTRAMUSCULAR | Status: AC
  Administered 2015-06-17: 10 mg via INTRAVENOUS
  Filled 2015-06-17: qty 1

## 2015-06-17 MED ORDER — PIPERACILLIN-TAZOBACTAM 3.375 G IVPB
3.3750 g | Freq: Three times a day (TID) | INTRAVENOUS | Status: AC
  Administered 2015-06-17 – 2015-06-19 (×7): 3.375 g via INTRAVENOUS
  Filled 2015-06-17 (×8): qty 50

## 2015-06-17 MED ORDER — ENOXAPARIN SODIUM 40 MG/0.4ML ~~LOC~~ SOLN
40.0000 mg | SUBCUTANEOUS | Status: DC
  Administered 2015-06-18 – 2015-06-19 (×2): 40 mg via SUBCUTANEOUS
  Filled 2015-06-17 (×3): qty 0.4

## 2015-06-17 MED ORDER — LATANOPROST 0.005 % OP SOLN
1.0000 [drp] | Freq: Every day | OPHTHALMIC | Status: DC
  Administered 2015-06-17 – 2015-06-19 (×3): 1 [drp] via OPHTHALMIC
  Filled 2015-06-17: qty 2.5

## 2015-06-17 NOTE — Progress Notes (Signed)
Patient's cbg was 67, IV D50 25 ml given, follow up cbg was 132.

## 2015-06-17 NOTE — Progress Notes (Signed)
Patient ID: Colleen [name], female   DOB: [birth year], 64 y.o.   MRN: [id number]     [facility]      [address] [place]   [place], [place] [address]    Phone: [phone] FAX: [phone]     Subjective: No growth on culture. h pylori is negative. Pt c/o chills.  Afebrile.  VSS. Little oob. No flatus or BM.  bilious output.   Objective:  Vital signs:  Filed Vitals:   06/17/15 0130 06/17/15 0400 06/17/15 0419 06/17/15 0500  BP: 165/70  175/85 166/68  Pulse:   101   Temp:   98.3 F (36.8 C)   TempSrc:   Oral   Resp:  18 18   Height:      Weight:   50.531 kg (111 lb 6.4 oz)   SpO2:  100% 98%     Last BM Date: 06/13/15  Intake/Output   Yesterday:  11/14 0701 - 11/15 0700 In: 4130 [I.V.:3650; Blood:330; IV Piggyback:150] Out: 3990 [Urine:3595; Emesis/NG output:300; Drains:95] This shift:  Total I/O In: 1798.7 [I.V.:1648.7; IV Piggyback:150] Out: -    Physical Exam: General: Pt awake/alert/oriented x4 in no acute distress Abdomen: Soft.  Nondistended.  +bs, tender around incision.  Incision is clean, fascia is intact.  No evidence of peritonitis.  No incarcerated hernias.    Problem List:   Active Problems:   Perforated ulcer (HCC)   Acute respiratory failure (HCC)    Results:   Labs: Results for orders placed or performed during the hospital encounter of 06/14/15 (from the past 48 hour(s))  CBC     Status: Abnormal   Collection Time: 06/15/15 11:31 AM  Result Value Ref Range   WBC 10.3 4.0 - 10.5 K/uL   RBC 2.59 (L) 3.87 - 5.11 MIL/uL   Hemoglobin 7.1 (L) 12.0 - 15.0 g/dL   HCT 95.6 (L) 38.7 - 56.4 %   MCV 81.9 78.0 - 100.0 fL   MCH 27.4 26.0 - 34.0 pg   MCHC 33.5 30.0 - 36.0 g/dL   RDW 33.2 95.1 - 88.4 %   Platelets 319 150 - 400 K/uL  Glucose, capillary     Status: None   Collection Time: 06/15/15 12:25 PM  Result Value Ref Range   Glucose-Capillary 71 65 - 99 mg/dL   Comment 1 Notify RN    Basic metabolic panel     Status: Abnormal   Collection Time: 06/15/15  2:31 PM  Result Value Ref Range   Sodium 137 135 - 145 mmol/L   Potassium 4.2 3.5 - 5.1 mmol/L   Chloride 99 (L) 101 - 111 mmol/L   CO2 28 22 - 32 mmol/L   Glucose, Bld 82 65 - 99 mg/dL   BUN 58 (H) 6 - 20 mg/dL   Creatinine, Ser 1.66 (H) 0.44 - 1.00 mg/dL   Calcium 6.8 (L) 8.9 - 10.3 mg/dL   GFR calc non Af Amer 17 (L) >60 mL/min   GFR calc Af Amer 20 (L) >60 mL/min    Comment: (NOTE) The eGFR has been calculated using the CKD EPI equation. This calculation has not been validated in all clinical situations. eGFR's persistently <60 mL/min signify possible Chronic Kidney Disease.    Anion gap 10 5 - 15  Glucose, capillary     Status: None   Collection Time: 06/15/15  4:50 PM  Result Value Ref Range   Glucose-Capillary 66 65 - 99 mg/dL   Comment 1 Notify  RN   Glucose, capillary     Status: Abnormal   Collection Time: 06/15/15  5:14 PM  Result Value Ref Range   Glucose-Capillary 105 (H) 65 - 99 mg/dL  Glucose, capillary     Status: Abnormal   Collection Time: 06/15/15  7:24 PM  Result Value Ref Range   Glucose-Capillary 51 (L) 65 - 99 mg/dL   Comment 1 Notify RN    Comment 2 Document in Chart   Glucose, capillary     Status: None   Collection Time: 06/15/15  8:01 PM  Result Value Ref Range   Glucose-Capillary 98 65 - 99 mg/dL   Comment 1 Capillary Specimen   Basic metabolic panel     Status: Abnormal   Collection Time: 06/15/15 11:24 PM  Result Value Ref Range   Sodium 137 135 - 145 mmol/L   Potassium 3.6 3.5 - 5.1 mmol/L   Chloride 100 (L) 101 - 111 mmol/L   CO2 27 22 - 32 mmol/L   Glucose, Bld 102 (H) 65 - 99 mg/dL   BUN 51 (H) 6 - 20 mg/dL   Creatinine, Ser 3.87 (H) 0.44 - 1.00 mg/dL   Calcium 6.7 (L) 8.9 - 10.3 mg/dL   GFR calc non Af Amer 20 (L) >60 mL/min   GFR calc Af Amer 23 (L) >60 mL/min    Comment: (NOTE) The eGFR has been calculated using the CKD EPI equation. This calculation has  not been validated in all clinical situations. eGFR's persistently <60 mL/min signify possible Chronic Kidney Disease.    Anion gap 10 5 - 15  Glucose, capillary     Status: Abnormal   Collection Time: 06/15/15 11:41 PM  Result Value Ref Range   Glucose-Capillary 62 (L) 65 - 99 mg/dL   Comment 1 Venous Specimen   Glucose, capillary     Status: Abnormal   Collection Time: 06/16/15 12:16 AM  Result Value Ref Range   Glucose-Capillary 191 (H) 65 - 99 mg/dL   Comment 1 Notify RN    Comment 2 Document in Chart   Glucose, capillary     Status: None   Collection Time: 06/16/15  1:55 AM  Result Value Ref Range   Glucose-Capillary 88 65 - 99 mg/dL   Comment 1 Notify RN    Comment 2 Document in Chart   Glucose, capillary     Status: None   Collection Time: 06/16/15  3:52 AM  Result Value Ref Range   Glucose-Capillary 76 65 - 99 mg/dL   Comment 1 Notify RN    Comment 2 Document in Chart   CBC     Status: Abnormal   Collection Time: 06/16/15  4:37 AM  Result Value Ref Range   WBC 10.9 (H) 4.0 - 10.5 K/uL   RBC 2.45 (L) 3.87 - 5.11 MIL/uL   Hemoglobin 6.7 (LL) 12.0 - 15.0 g/dL    Comment: REPEATED TO VERIFY CRITICAL RESULT CALLED TO, READ BACK BY AND VERIFIED WITH: A.HAGGARD,RN [id number] 06/16/15 M.CAMPBELL    HCT 20.2 (L) 36.0 - 46.0 %   MCV 82.4 78.0 - 100.0 fL   MCH 27.3 26.0 - 34.0 pg   MCHC 33.2 30.0 - 36.0 g/dL   RDW 32.9 51.8 - 84.1 %   Platelets 338 150 - 400 K/uL  H. pylori antibody, IgG     Status: None   Collection Time: 06/16/15  4:37 AM  Result Value Ref Range   H Pylori IgG <0.9 0.0 - 0.8 U/mL  Comment: (NOTE)                             Negative            <0.9                             Indeterminate  0.9 - 1.0                             Positive            >1.0 Performed At: [facility] [address] [place], [place] [id number] Mila Homer MD WU:[id number]   Basic metabolic panel     Status: Abnormal   Collection Time: 06/16/15  4:37 AM   Result Value Ref Range   Sodium 138 135 - 145 mmol/L   Potassium 3.6 3.5 - 5.1 mmol/L   Chloride 101 101 - 111 mmol/L   CO2 26 22 - 32 mmol/L   Glucose, Bld 98 65 - 99 mg/dL   BUN 47 (H) 6 - 20 mg/dL   Creatinine, Ser 2.53 (H) 0.44 - 1.00 mg/dL   Calcium 6.8 (L) 8.9 - 10.3 mg/dL   GFR calc non Af Amer 22 (L) >60 mL/min   GFR calc Af Amer 25 (L) >60 mL/min    Comment: (NOTE) The eGFR has been calculated using the CKD EPI equation. This calculation has not been validated in all clinical situations. eGFR's persistently <60 mL/min signify possible Chronic Kidney Disease.    Anion gap 11 5 - 15  Magnesium     Status: Abnormal   Collection Time: 06/16/15  4:37 AM  Result Value Ref Range   Magnesium 1.2 (L) 1.7 - 2.4 mg/dL  Phosphorus     Status: Abnormal   Collection Time: 06/16/15  4:37 AM  Result Value Ref Range   Phosphorus 5.6 (H) 2.5 - 4.6 mg/dL  Prepare RBC     Status: None   Collection Time: 06/16/15  6:47 AM  Result Value Ref Range   Order Confirmation ORDER PROCESSED BY BLOOD BANK   Glucose, capillary     Status: None   Collection Time: 06/16/15  7:24 AM  Result Value Ref Range   Glucose-Capillary 88 65 - 99 mg/dL   Comment 1 Capillary Specimen    Comment 2 Notify RN   Glucose, capillary     Status: None   Collection Time: 06/16/15 12:09 PM  Result Value Ref Range   Glucose-Capillary 86 65 - 99 mg/dL   Comment 1 Capillary Specimen    Comment 2 Notify RN   Glucose, capillary     Status: None   Collection Time: 06/16/15  3:14 PM  Result Value Ref Range   Glucose-Capillary 90 65 - 99 mg/dL   Comment 1 Capillary Specimen    Comment 2 Notify RN   Glucose, capillary     Status: None   Collection Time: 06/16/15  8:12 PM  Result Value Ref Range   Glucose-Capillary 67 65 - 99 mg/dL  Glucose, capillary     Status: Abnormal   Collection Time: 06/16/15  9:10 PM  Result Value Ref Range   Glucose-Capillary 132 (H) 65 - 99 mg/dL  Glucose, capillary     Status: Abnormal    Collection Time: 06/16/15 11:56 PM  Result Value Ref Range   Glucose-Capillary  100 (H) 65 - 99 mg/dL  Glucose, capillary     Status: Abnormal   Collection Time: 06/17/15  4:16 AM  Result Value Ref Range   Glucose-Capillary 113 (H) 65 - 99 mg/dL  Glucose, capillary     Status: Abnormal   Collection Time: 06/17/15  8:04 AM  Result Value Ref Range   Glucose-Capillary 117 (H) 65 - 99 mg/dL    Imaging / Studies: Dg Chest Port 1 View  06/16/2015  CLINICAL DATA:  COPD.  Postoperative. EXAM: PORTABLE CHEST 1 VIEW COMPARISON:  06/14/2015 chest radiograph. FINDINGS: Surgical hardware overlies the lower cervical spine. Right internal jugular central venous catheter terminates in the lower third of the superior vena cava. Enteric tube enters the stomach, with the tip not seen on this image. Interval extubation. Stable cardiomediastinal silhouette with normal heart size. No pneumothorax. No pleural effusion. Clear lungs, with no focal lung consolidation and no pulmonary edema. Right proximal humeral prosthesis. IMPRESSION: Well-positioned enteric tube and right IJ central venous catheter. No active cardiopulmonary disease. Electronically Signed   By: Delbert Phenix M.D.   On: 06/16/2015 09:58    Medications / Allergies:  Scheduled Meds: . sodium chloride   Intravenous Once  . antiseptic oral rinse  7 mL Mouth Rinse QID  . budesonide  0.25 mg Nebulization BID  . chlorhexidine gluconate  15 mL Mouth Rinse BID  . enoxaparin (LOVENOX) injection  30 mg Subcutaneous Q24H  . fluconazole (DIFLUCAN) IV  100 mg Intravenous Q24H  . HYDROmorphone   Intravenous 6 times per day  . insulin aspart  0-9 Units Subcutaneous 6 times per day  . ipratropium-albuterol  3 mL Nebulization BID  . pantoprazole (PROTONIX) IV  40 mg Intravenous Q12H  . piperacillin-tazobactam (ZOSYN)  IV  2.25 g Intravenous 3 times per day   Continuous Infusions: . dextrose 5 % and 0.45% NaCl 125 mL/hr at 06/17/15 0535   PRN  Meds:.diphenhydrAMINE **OR** diphenhydrAMINE, Influenza vac split quadrivalent PF, naloxone **AND** sodium chloride, ondansetron (ZOFRAN) IV, phenol, pneumococcal 23 valent vaccine, sodium chloride, white petrolatum  Antibiotics: Anti-infectives    Start     Dose/Rate Route Frequency Ordered Stop   06/15/15 0900  fluconazole (DIFLUCAN) IVPB 100 mg     100 mg 50 mL/hr over 60 Minutes Intravenous Every 24 hours 06/15/15 0755     06/14/15 1600  [MAR Hold]  piperacillin-tazobactam (ZOSYN) IVPB 3.375 g  Status:  Discontinued     (MAR Hold since 06/14/15 1040)   3.375 g 12.5 mL/hr over 240 Minutes Intravenous Every 8 hours 06/14/15 0907 06/14/15 1122   06/14/15 1400  piperacillin-tazobactam (ZOSYN) IVPB 2.25 g     2.25 g 100 mL/hr over 30 Minutes Intravenous 3 times per day 06/14/15 1122     06/14/15 0900  piperacillin-tazobactam (ZOSYN) IVPB 3.375 g     3.375 g 100 mL/hr over 30 Minutes Intravenous  Once 06/14/15 0857 06/14/15 1039       Assessment/Plan POD 3, s/p ex lap with repair of perforated gastric ulcer with graham patch---Dr. Janee Morn 06/14/15 -will consider clamping trial today -needs to mobilize  -cont pulm toileting -cont PCA for pain control -BID WD dressing changes to midline wound -cont JP drain for now, until eating diet ID-zosyn/diflucan D#3 ABL anemia-s/p blood tx, labs pending AKI-good UOP, foley in place.  Labs are pending.  DVT proph-SCDs/Lovenox (anemia not related to bleeding but dilutional and h/o anemia. No need, at this time, to hold lovenox for bleeding concerns)   Anette Riedel  Breyona Swander, [facility] Pager [phone]) For consults and floor pages call [phone])  06/17/2015 8:54 AM

## 2015-06-17 NOTE — Progress Notes (Signed)
[facility]                                                                              Patient Demographics  Colleen [name], is a 64 y.o. female, DOB - 1950/09/20, HQI:[id number]  Admit date - 06/14/2015   Admitting Physician Violeta Gelinas, MD  Outpatient Primary MD for the patient is No primary care provider on file.  LOS - 3   Chief Complaint  Patient presents with  . Shortness of Breath       Brief HPI   Per PCCM admit note 64 year old female w/ h/o COPD, recently treated by PCP for PNA. Admitted on 11/12 w/ acute abd pain and dyspnea. Dx eval demonstrated pneumoperitoneum. Went for emergent exploratory lap. Found ruptured gastric ulcer. This was repaired, biopsies were obtained and JP tube placed. She returned to the ICU on the vent. PCCM asked to assume primary care.  The patient was transferred to the floor on 06/16/15, [facility] assumed care on 06/17/59  Assessment & Plan   Perforated gastric ulcer status post repair with graham patch (Dr. Janee Morn on 11/12) - Postop day #3 - Gen. surgery managing, currently on NGT suction, Dilaudid PCA for pain control - Continue Zosyn for acute peritonitis - Continue IV PPI, H. pylori negative  Active Problems:    Acute respiratory failure (HCC), underlying COPD - Currently stable, O2 sats 98% on room air, extubated on 11/13  Acute kidney injury; likely due to sepsis from #1, dehydration - Improving, continue gentle hydration   COPD - Currently stable, no wheezing, was successfully extubated on 11/13  Sepsis secondary to peritonitis from perforated gastric ulcer - Currently stable, continue Zosyn - Influenza negative, urine culture negative, blood cultures negative   Anemia - Hemoglobin was 6.7 on 11/14, received 2 units packed RBC, currently 9.6, no active bleeding  Hypokalemia Replaced  Code Status: Full code  Family Communication: Discussed in detail with the patient, all imaging  results, lab results explained to the patient   Disposition Plan:   Time Spent in minutes   Procedures  EXPLORATORY LAPAROTOMY GASTRIC BIOPSY REPAIR PERFORATED GASTRIC ULCER  intubation   Consults   Gen surgery Admitted by CCM  DVT Prophylaxis   Lovenox   Medications  Scheduled Meds: . sodium chloride   Intravenous Once  . antiseptic oral rinse  7 mL Mouth Rinse QID  . budesonide  0.25 mg Nebulization BID  . chlorhexidine gluconate  15 mL Mouth Rinse BID  . enoxaparin (LOVENOX) injection  30 mg Subcutaneous Q24H  . fluconazole (DIFLUCAN) IV  100 mg Intravenous Q24H  . HYDROmorphone   Intravenous 6 times per day  . insulin aspart  0-9 Units Subcutaneous 6 times per day  . ipratropium-albuterol  3 mL Nebulization BID  . pantoprazole (PROTONIX) IV  40 mg Intravenous Q12H  . piperacillin-tazobactam (ZOSYN)  IV  2.25 g Intravenous 3 times per day   Continuous Infusions: . dextrose 5 % and 0.45% NaCl 125 mL/hr at 06/17/15 0535   PRN Meds:.diphenhydrAMINE **OR** diphenhydrAMINE, Influenza vac split quadrivalent PF, naloxone **AND** sodium chloride, ondansetron (ZOFRAN) IV, phenol, pneumococcal 23  valent vaccine, sodium chloride, white petrolatum   Antibiotics   Anti-infectives    Start     Dose/Rate Route Frequency Ordered Stop   06/15/15 0900  fluconazole (DIFLUCAN) IVPB 100 mg     100 mg 50 mL/hr over 60 Minutes Intravenous Every 24 hours 06/15/15 0755     06/14/15 1600  [MAR Hold]  piperacillin-tazobactam (ZOSYN) IVPB 3.375 g  Status:  Discontinued     (MAR Hold since 06/14/15 1040)   3.375 g 12.5 mL/hr over 240 Minutes Intravenous Every 8 hours 06/14/15 0907 06/14/15 1122   06/14/15 1400  piperacillin-tazobactam (ZOSYN) IVPB 2.25 g     2.25 g 100 mL/hr over 30 Minutes Intravenous 3 times per day 06/14/15 1122     06/14/15 0900  piperacillin-tazobactam (ZOSYN) IVPB 3.375 g     3.375 g 100 mL/hr over 30 Minutes Intravenous  Once 06/14/15 0857 06/14/15 1039          Subjective:   Colleen [name] was seen and examined today. Feeling better today.  pain controlled with PCA. Patient denies dizziness, chest pain, shortness of breath,  new weakness, numbess, tingling. No acute events overnight.    Objective:   Blood pressure 165/95, pulse 103, temperature 97.3 F (36.3 C), temperature source Oral, resp. rate 13, height  (1.676 m), weight 50.531 kg (111 lb 6.4 oz), SpO2 98 %.  Wt Readings from Last 3 Encounters:  06/17/15 50.531 kg (111 lb 6.4 oz)  09/04/12 45.36 kg (100 lb)  03/05/09 58.12 kg (128 lb 2.1 oz)     Intake/Output Summary (Last 24 hours) at 06/17/15 1226 Last data filed at 06/17/15 1049  Gross per 24 hour  Intake 5173.67 ml  Output   4575 ml  Net 598.67 ml    Exam  General: Alert and oriented x 3, NAD, NGT +  HEENT:  PERRLA, EOMI, Anicteric Sclera, mucous membranes moist.   Neck: Supple, no JVD, no masses  CVS: S1 S2 auscultated, no rubs, murmurs or gallops. Regular rate and rhythm.  Respiratory: Clear to auscultation bilaterally, no wheezing, rales or rhonchi  Abdomen: Soft, dressing intact   Ext: no cyanosis clubbing or edema  Neuro: AAOx3, Cr N's II- XII. Strength 5/5 upper and lower extremities bilaterally  Skin: No rashes  Psych: Normal affect and demeanor, alert and oriented x3    Data Review   Micro Results Recent Results (from the past 240 hour(s))  Blood Culture (routine x 2)     Status: None (Preliminary result)   Collection Time: 06/14/15  9:10 AM  Result Value Ref Range Status   Specimen Description BLOOD LEFT ANTECUBITAL  Final   Special Requests BOTTLES DRAWN AEROBIC AND ANAEROBIC 5CC  Final   Culture NO GROWTH 2 DAYS  Final   Report Status PENDING  Incomplete  Blood Culture (routine x 2)     Status: None (Preliminary result)   Collection Time: 06/14/15  9:25 AM  Result Value Ref Range Status   Specimen Description BLOOD LEFT ANTECUBITAL  Final   Special Requests IN PEDIATRIC  BOTTLE 5CC  Final   Culture NO GROWTH 2 DAYS  Final   Report Status PENDING  Incomplete  Anaerobic culture     Status: None (Preliminary result)   Collection Time: 06/14/15 11:57 AM  Result Value Ref Range Status   Specimen Description FLUID ABDOMEN A  Final   Special Requests PATIENT ON FOLLOWING ZOSYN  Final   Gram Stain   Final    RARE WBC  PRESENT, PREDOMINANTLY PMN FEW GRAM POSITIVE COCCOBACILLUS    Culture   Final    NO ANAEROBES ISOLATED; CULTURE IN PROGRESS FOR 5 DAYS   Report Status PENDING  Incomplete  Body fluid culture     Status: None (Preliminary result)   Collection Time: 06/14/15  1:04 PM  Result Value Ref Range Status   Specimen Description FLUID ABDOMEN A  Final   Special Requests PATIENT ON FOLLOWING ZOSYN  Final   Gram Stain   Final    RARE WBC PRESENT, PREDOMINANTLY PMN FEW GRAM POSITIVE COCCOBACILLUS    Culture NO GROWTH 3 DAYS  Final   Report Status PENDING  Incomplete  Urine culture     Status: None   Collection Time: 06/14/15  2:30 PM  Result Value Ref Range Status   Specimen Description URINE, CATHETERIZED  Final   Special Requests NONE  Final   Culture NO GROWTH 2 DAYS  Final   Report Status 06/16/2015 FINAL  Final  MRSA PCR Screening     Status: None   Collection Time: 06/14/15  8:05 PM  Result Value Ref Range Status   MRSA by PCR NEGATIVE NEGATIVE Final    Comment:        The GeneXpert MRSA Assay (FDA approved for NASAL specimens only), is one component of a comprehensive MRSA colonization surveillance program. It is not intended to diagnose MRSA infection nor to guide or monitor treatment for MRSA infections.     Radiology Reports Dg Chest 2 View  06/14/2015  CLINICAL DATA:  Short of breath. Diagnosed with pneumonia 2 weeks ago. History of COPD and hypertension. EXAM: CHEST  2 VIEW COMPARISON:  06/10/2015 FINDINGS: There is pneumoperitoneum below each hemidiaphragm, new from the prior study. Lungs are clear.  No pleural effusion or  pneumothorax. Normal heart, mediastinum hila. Partly imaged right shoulder prosthesis appears well aligned and stable. IMPRESSION: 1. Significant pneumoperitoneum, new since the prior chest radiographs. 2. No acute cardiopulmonary disease. Electronically Signed   By: Amie Portland M.D.   On: 06/14/2015 08:47   Dg Chest Port 1 View  06/16/2015  CLINICAL DATA:  COPD.  Postoperative. EXAM: PORTABLE CHEST 1 VIEW COMPARISON:  06/14/2015 chest radiograph. FINDINGS: Surgical hardware overlies the lower cervical spine. Right internal jugular central venous catheter terminates in the lower third of the superior vena cava. Enteric tube enters the stomach, with the tip not seen on this image. Interval extubation. Stable cardiomediastinal silhouette with normal heart size. No pneumothorax. No pleural effusion. Clear lungs, with no focal lung consolidation and no pulmonary edema. Right proximal humeral prosthesis. IMPRESSION: Well-positioned enteric tube and right IJ central venous catheter. No active cardiopulmonary disease. Electronically Signed   By: Delbert Phenix M.D.   On: 06/16/2015 09:58   Dg Chest Port 1 View  06/14/2015  CLINICAL DATA:  Status post repositioning of endotracheal tube today. EXAM: PORTABLE CHEST 1 VIEW COMPARISON:  Single view of the chest earlier this same day. FINDINGS: Endotracheal tube is in place with the tip 2.0 cm above the carina. Right IJ catheter and OG tube are again seen. The lungs are clear. No pneumothorax or pleural effusion. Heart size normal. IMPRESSION: Tip of endotracheal tube projects 2 cm above the carina. No other change. Electronically Signed   By: Drusilla Kanner M.D.   On: 06/14/2015 18:49   Dg Chest Port 1 View  06/14/2015  CLINICAL DATA:  Acute respiratory failure, intubated EXAM: PORTABLE CHEST 1 VIEW COMPARISON:  06/14/2015 FINDINGS: Endotracheal tube at  the thoracic inlet, 10 cm above the carina. Lungs are clear.  No pleural effusion or pneumothorax. The heart is  normal in size. Right IJ venous catheter terminates at the cavoatrial junction. Right shoulder arthroplasty. Pneumoperitoneum is less conspicuous on the current study. IMPRESSION: Endotracheal tube at the thoracic inlet, 10 cm above the carina. Right IJ venous catheter terminates at the cavoatrial junction. Electronically Signed   By: Charline BillsSriyesh  Krishnan M.D.   On: 06/14/2015 15:44    CBC  Recent Labs Lab 06/14/15 0823 06/15/15 0512 06/15/15 1131 06/16/15 0437 06/17/15 0830  WBC 2.7* 10.5 10.3 10.9* 12.3*  HGB 12.4 7.0* 7.1* 6.7* 9.6*  HCT 37.8 21.1* 21.2* 20.2* 27.9*  PLT 544* 314 319 338 301  MCV 85.3 83.7 81.9 82.4 80.6  MCH 28.0 27.8 27.4 27.3 27.7  MCHC 32.8 33.2 33.5 33.2 34.4  RDW 15.2 15.2 14.8 15.1 15.9*  LYMPHSABS 0.7  --   --   --   --   MONOABS 0.2  --   --   --   --   EOSABS 0.0  --   --   --   --   BASOSABS 0.0  --   --   --   --     Chemistries   Recent Labs Lab 06/14/15 0823  06/15/15 0512 06/15/15 1431 06/15/15 2324 06/16/15 0437 06/17/15 0830  NA 130*  < > 136 137 137 138 136  K 5.8*  < > 4.1 4.2 3.6 3.6 3.3*  CL 98*  < > 101 99* 100* 101 100*  CO2 12*  < > 24 28 27 26 24   GLUCOSE 147*  < > 152* 82 102* 98 118*  BUN 78*  < > 61* 58* 51* 47* 22*  CREATININE 3.94*  < > 2.95* 2.75* 2.50* 2.27* 1.50*  CALCIUM 9.2  < > 6.4* 6.8* 6.7* 6.8* 8.5*  MG  --   --   --   --   --  1.2* 1.4*  AST 27  --   --   --   --   --   --   ALT 13*  --   --   --   --   --   --   ALKPHOS 77  --   --   --   --   --   --   BILITOT 0.4  --   --   --   --   --   --   < > = values in this interval not displayed. ------------------------------------------------------------------------------------------------------------------ estimated creatinine clearance is 31 mL/min (by C-G formula based on Cr of 1.5). ------------------------------------------------------------------------------------------------------------------ No results for input(s): HGBA1C in the last 72  hours. ------------------------------------------------------------------------------------------------------------------  Recent Labs  06/14/15 1340  TRIG 113   ------------------------------------------------------------------------------------------------------------------ No results for input(s): TSH, T4TOTAL, T3FREE, THYROIDAB in the last 72 hours.  Invalid input(s): FREET3 ------------------------------------------------------------------------------------------------------------------ No results for input(s): VITAMINB12, FOLATE, FERRITIN, TIBC, IRON, RETICCTPCT in the last 72 hours.  Coagulation profile No results for input(s): INR, PROTIME in the last 168 hours.  No results for input(s): DDIMER in the last 72 hours.  Cardiac Enzymes  Recent Labs Lab 06/14/15 0823  TROPONINI <0.03   ------------------------------------------------------------------------------------------------------------------ Invalid input(s): POCBNP   Recent Labs  06/16/15 2012 06/16/15 2110 06/16/15 2356 06/17/15 0416 06/17/15 0804 06/17/15 1143  GLUCAP 67 132* 100* 113* 117* 124*     RAI,RIPUDEEP M.D. [facility] 06/17/2015, 12:26 PM  Pager: [phone] Between 7am to 7pm - call Pager - [phone]  After 7pm go to [email or web address] - password [email or web address]  Call night coverage person covering after 7pm

## 2015-06-17 NOTE — Progress Notes (Signed)
Pharmacy: Zosyn WBC 12.2, creat down to 1.5. AF Plan: increase zosyn 2.25 gm IV q8h to zosyn 3.375 gm IV q8h, infuse each dose over 4 hours  -abx may be stopped tomorrow per [facility]. Pharmacy will sign off Thanks Herby AbrahamMichelle T. Malkie Wille, Pharm.D. [phone] 06/17/2015 2:16 PM

## 2015-06-17 NOTE — Clinical Social Work Note (Signed)
CSW received consult for patient needing SNF placement, CSW to complete assessment.  Ervin KnackEric R. Demico Ploch, MSW, Theresia MajorsLCSWA [phone] 06/17/2015 6:23 PM

## 2015-06-18 ENCOUNTER — Encounter (HOSPITAL_COMMUNITY): Payer: Self-pay | Admitting: General Practice

## 2015-06-18 DIAGNOSIS — E876 Hypokalemia: Secondary | ICD-10-CM

## 2015-06-18 LAB — BASIC METABOLIC PANEL
ANION GAP: 10 (ref 5–15)
BUN: 14 mg/dL (ref 6–20)
CALCIUM: 8.4 mg/dL — AB (ref 8.9–10.3)
CO2: 23 mmol/L (ref 22–32)
CREATININE: 1.29 mg/dL — AB (ref 0.44–1.00)
Chloride: 98 mmol/L — ABNORMAL LOW (ref 101–111)
GFR, EST AFRICAN AMERICAN: 50 mL/min — AB (ref >60)
GFR, EST NON AFRICAN AMERICAN: 43 mL/min — AB (ref >60)
GLUCOSE: 124 mg/dL — AB (ref 65–99)
Potassium: 3.3 mmol/L — ABNORMAL LOW (ref 3.5–5.1)
Sodium: 131 mmol/L — ABNORMAL LOW (ref 135–145)

## 2015-06-18 LAB — GLUCOSE, CAPILLARY
GLUCOSE-CAPILLARY: 134 mg/dL — AB (ref 65–99)
GLUCOSE-CAPILLARY: 108 mg/dL — AB (ref 65–99)
Glucose-Capillary: 109 mg/dL — ABNORMAL HIGH (ref 65–99)
Glucose-Capillary: 60 mg/dL — ABNORMAL LOW (ref 65–99)
Glucose-Capillary: 83 mg/dL (ref 65–99)
Glucose-Capillary: 90 mg/dL (ref 65–99)
Glucose-Capillary: 93 mg/dL (ref 65–99)

## 2015-06-18 LAB — MAGNESIUM: Magnesium: 1.1 mg/dL — ABNORMAL LOW (ref 1.7–2.4)

## 2015-06-18 LAB — BODY FLUID CULTURE: Culture: NO GROWTH

## 2015-06-18 LAB — CBC
HCT: 30.8 % — ABNORMAL LOW (ref 36.0–46.0)
Hemoglobin: 10.3 g/dL — ABNORMAL LOW (ref 12.0–15.0)
MCH: 26.8 pg (ref 26.0–34.0)
MCHC: 33.4 g/dL (ref 30.0–36.0)
MCV: 80 fL (ref 78.0–100.0)
PLATELETS: 314 10*3/uL (ref 150–400)
RBC: 3.85 MIL/uL — ABNORMAL LOW (ref 3.87–5.11)
RDW: 15.3 % (ref 11.5–15.5)
WBC: 12.8 10*3/uL — AB (ref 4.0–10.5)

## 2015-06-18 LAB — PHOSPHORUS: PHOSPHORUS: 3.3 mg/dL (ref 2.5–4.6)

## 2015-06-18 MED ORDER — OXYCODONE-ACETAMINOPHEN 5-325 MG PO TABS
1.0000 | ORAL_TABLET | ORAL | Status: DC | PRN
  Administered 2015-06-18 – 2015-06-20 (×6): 2 via ORAL
  Filled 2015-06-18 (×7): qty 2

## 2015-06-18 MED ORDER — POTASSIUM CHLORIDE 10 MEQ/100ML IV SOLN
INTRAVENOUS | Status: AC
  Administered 2015-06-18: 09:00:00
  Filled 2015-06-18: qty 100

## 2015-06-18 MED ORDER — MAGNESIUM SULFATE 2 GM/50ML IV SOLN
2.0000 g | Freq: Once | INTRAVENOUS | Status: AC
  Administered 2015-06-18: 2 g via INTRAVENOUS
  Filled 2015-06-18: qty 50

## 2015-06-18 MED ORDER — PHENOL 1.4 % MT LIQD: 1.0000 | OROMUCOSAL | Status: DC | PRN

## 2015-06-18 MED ORDER — IPRATROPIUM-ALBUTEROL 0.5-2.5 (3) MG/3ML IN SOLN: 3.0000 mL | Freq: Once | RESPIRATORY_TRACT | Status: DC | PRN

## 2015-06-18 MED ORDER — HYDRALAZINE HCL 20 MG/ML IJ SOLN
10.0000 mg | Freq: Four times a day (QID) | INTRAMUSCULAR | Status: DC | PRN
Start: 2015-06-18 — End: 2015-06-20

## 2015-06-18 MED ORDER — POTASSIUM CHLORIDE 10 MEQ/100ML IV SOLN
10.0000 meq | INTRAVENOUS | Status: AC
  Administered 2015-06-18 (×3): 10 meq via INTRAVENOUS

## 2015-06-18 MED ORDER — LORAZEPAM 2 MG/ML IJ SOLN: 0.5000 mg | Freq: Once | INTRAMUSCULAR | Status: DC

## 2015-06-18 MED ORDER — LORAZEPAM 2 MG/ML IJ SOLN
INTRAMUSCULAR | Status: AC
  Filled 2015-06-18: qty 1

## 2015-06-18 MED ORDER — METOPROLOL TARTRATE 1 MG/ML IV SOLN
2.5000 mg | Freq: Three times a day (TID) | INTRAVENOUS | Status: DC
  Administered 2015-06-18 – 2015-06-20 (×7): 2.5 mg via INTRAVENOUS
  Filled 2015-06-18 (×7): qty 5

## 2015-06-18 MED ORDER — POTASSIUM CHLORIDE 10 MEQ/100ML IV SOLN
10.0000 meq | INTRAVENOUS | Status: AC
  Administered 2015-06-18 (×4): 10 meq via INTRAVENOUS
  Filled 2015-06-18: qty 100

## 2015-06-18 MED ORDER — ACETAMINOPHEN 325 MG PO TABS: 325.0000 mg | ORAL_TABLET | Freq: Four times a day (QID) | ORAL | Status: DC | PRN

## 2015-06-18 MED ORDER — HYDROMORPHONE HCL 1 MG/ML IJ SOLN: 1.0000 mg | INTRAMUSCULAR | Status: DC | PRN

## 2015-06-18 NOTE — Progress Notes (Signed)
Patient ID: Colleen [name], female   DOB: [birth year], 64 y.o.   MRN: [id number]     [facility]      [address] [place], [place] [address]    Phone: [phone] FAX: [phone]     Subjective: Has not gotten oob. VSS.  Afebrile. NGT half way out, no n/v.  Passing flatus.  351m output recorded yesterday.  561mP drain output.  k 3.3.  sCr improving.   Objective:  Vital signs:  Filed Vitals:   06/18/15 0400 06/18/15 0500 06/18/15 0833 06/18/15 0847  BP:  143/79    Pulse:  115 100   Temp:  97.9 F (36.6 C)    TempSrc:  Oral    Resp: 14 14 16 18   Height:      Weight:  51.075 kg (112 lb 9.6 oz)    SpO2: 100% 100% 100% 100%    Last BM Date: 06/13/15  Intake/Output   Yesterday:  11/15 0701 - 11/16 0700 In: 3397.8 [I.V.:2727.8; NG/GT:100; IV Piggyback:550] Out: 370998Urine:3350; Emesis/NG output:300; Drains:55] This shift:  Total I/O In: 10338250.5I.V.:10868.8; IV Piggyback:95316.7] Out: -    Physical Exam: General: Pt awake/alert/oriented x4 in no acute distress Abdomen: Soft. Nondistended. +bs, tender around incision. Incision is clean, fascia is intact, dressing replaced. No evidence of peritonitis. No incarcerated hernias.  Problem List:   Active Problems:   [place] ([place]  Acute respiratory failure ([facility]   Results:   Labs: Results for orders placed or performed during the hospital encounter of 06/14/15 (from the past 48 hour(s))  Glucose, capillary     Status: None   Collection Time: 06/16/15 12:09 PM  Result Value Ref Range   Glucose-Capillary 86 65 - 99 mg/dL   Comment 1 Capillary Specimen    Comment 2 Notify RN   Glucose, capillary     Status: None   Collection Time: 06/16/15  3:14 PM  Result Value Ref Range   Glucose-Capillary 90 65 - 99 mg/dL   Comment 1 Capillary Specimen    Comment 2 Notify RN   Glucose, capillary     Status: None   Collection Time: 06/16/15  8:12 PM   Result Value Ref Range   Glucose-Capillary 67 65 - 99 mg/dL  Glucose, capillary     Status: Abnormal   Collection Time: 06/16/15  9:10 PM  Result Value Ref Range   Glucose-Capillary 132 (H) 65 - 99 mg/dL  Glucose, capillary     Status: Abnormal   Collection Time: 06/16/15 11:56 PM  Result Value Ref Range   Glucose-Capillary 100 (H) 65 - 99 mg/dL  Glucose, capillary     Status: Abnormal   Collection Time: 06/17/15  4:16 AM  Result Value Ref Range   Glucose-Capillary 113 (H) 65 - 99 mg/dL  Glucose, capillary     Status: Abnormal   Collection Time: 06/17/15  8:04 AM  Result Value Ref Range   Glucose-Capillary 117 (H) 65 - 99 mg/dL  CBC     Status: Abnormal   Collection Time: 06/17/15  8:30 AM  Result Value Ref Range   WBC 12.3 (H) 4.0 - 10.5 K/uL   RBC 3.46 (L) 3.87 - 5.11 MIL/uL   Hemoglobin 9.6 (L) 12.0 - 15.0 g/dL    Comment: REPEATED TO VERIFY POST TRANSFUSION SPECIMEN    HCT 27.9 (L) 36.0 - 46.0 %   MCV 80.6 78.0 - 100.0 fL   MCH 27.7 26.0 -  34.0 pg   MCHC 34.4 30.0 - 36.0 g/dL   RDW 15.9 (H) 11.5 - 15.5 %   Platelets 301 150 - 400 K/uL  Basic metabolic panel     Status: Abnormal   Collection Time: 06/17/15  8:30 AM  Result Value Ref Range   Sodium 136 135 - 145 mmol/L   Potassium 3.3 (L) 3.5 - 5.1 mmol/L   Chloride 100 (L) 101 - 111 mmol/L   CO2 24 22 - 32 mmol/L   Glucose, Bld 118 (H) 65 - 99 mg/dL   BUN 22 (H) 6 - 20 mg/dL   Creatinine, Ser 1.50 (H) 0.44 - 1.00 mg/dL   Calcium 8.5 (L) 8.9 - 10.3 mg/dL   GFR calc non Af Amer 36 (L) >60 mL/min   GFR calc Af Amer 42 (L) >60 mL/min    Comment: (NOTE) The eGFR has been calculated using the CKD EPI equation. This calculation has not been validated in all clinical situations. eGFR's persistently <60 mL/min signify possible Chronic Kidney Disease.    Anion gap 12 5 - 15  Magnesium     Status: Abnormal   Collection Time: 06/17/15  8:30 AM  Result Value Ref Range   Magnesium 1.4 (L) 1.7 - 2.4 mg/dL  Phosphorus      Status: None   Collection Time: 06/17/15  8:30 AM  Result Value Ref Range   Phosphorus 4.2 2.5 - 4.6 mg/dL  Glucose, capillary     Status: Abnormal   Collection Time: 06/17/15 11:43 AM  Result Value Ref Range   Glucose-Capillary 124 (H) 65 - 99 mg/dL   Comment 1 Notify RN   Glucose, capillary     Status: Abnormal   Collection Time: 06/17/15  4:08 PM  Result Value Ref Range   Glucose-Capillary 103 (H) 65 - 99 mg/dL  Glucose, capillary     Status: None   Collection Time: 06/17/15  8:17 PM  Result Value Ref Range   Glucose-Capillary 99 65 - 99 mg/dL  Glucose, capillary     Status: Abnormal   Collection Time: 06/18/15 12:18 AM  Result Value Ref Range   Glucose-Capillary 109 (H) 65 - 99 mg/dL  Glucose, capillary     Status: Abnormal   Collection Time: 06/18/15  4:33 AM  Result Value Ref Range   Glucose-Capillary 134 (H) 65 - 99 mg/dL  Magnesium     Status: Abnormal   Collection Time: 06/18/15  4:41 AM  Result Value Ref Range   Magnesium 1.1 (L) 1.7 - 2.4 mg/dL  Phosphorus     Status: None   Collection Time: 06/18/15  4:41 AM  Result Value Ref Range   Phosphorus 3.3 2.5 - 4.6 mg/dL  CBC     Status: Abnormal   Collection Time: 06/18/15  4:41 AM  Result Value Ref Range   WBC 12.8 (H) 4.0 - 10.5 K/uL   RBC 3.85 (L) 3.87 - 5.11 MIL/uL   Hemoglobin 10.3 (L) 12.0 - 15.0 g/dL   HCT 30.8 (L) 36.0 - 46.0 %   MCV 80.0 78.0 - 100.0 fL   MCH 26.8 26.0 - 34.0 pg   MCHC 33.4 30.0 - 36.0 g/dL   RDW 15.3 11.5 - 15.5 %   Platelets 314 150 - 400 K/uL  Basic metabolic panel     Status: Abnormal   Collection Time: 06/18/15  4:41 AM  Result Value Ref Range   Sodium 131 (L) 135 - 145 mmol/L   Potassium 3.3 (L) 3.5 -  5.1 mmol/L   Chloride 98 (L) 101 - 111 mmol/L   CO2 23 22 - 32 mmol/L   Glucose, Bld 124 (H) 65 - 99 mg/dL   BUN 14 6 - 20 mg/dL   Creatinine, Ser 1.29 (H) 0.44 - 1.00 mg/dL   Calcium 8.4 (L) 8.9 - 10.3 mg/dL   GFR calc non Af Amer 43 (L) >60 mL/min   GFR calc Af Amer 50 (L)  >60 mL/min    Comment: (NOTE) The eGFR has been calculated using the CKD EPI equation. This calculation has not been validated in all clinical situations. eGFR's persistently <60 mL/min signify possible Chronic Kidney Disease.    Anion gap 10 5 - 15  Glucose, capillary     Status: Abnormal   Collection Time: 06/18/15  7:45 AM  Result Value Ref Range   Glucose-Capillary 108 (H) 65 - 99 mg/dL    Imaging / Studies: Dg Chest Port 1 View  06/16/2015  CLINICAL DATA:  COPD.  Postoperative. EXAM: PORTABLE CHEST 1 VIEW COMPARISON:  06/14/2015 chest radiograph. FINDINGS: Surgical hardware overlies the lower cervical spine. Right internal jugular central venous catheter terminates in the lower third of the superior vena cava. Enteric tube enters the stomach, with the tip not seen on this image. Interval extubation. Stable cardiomediastinal silhouette with normal heart size. No pneumothorax. No pleural effusion. Clear lungs, with no focal lung consolidation and no pulmonary edema. Right proximal humeral prosthesis. IMPRESSION: Well-positioned enteric tube and right IJ central venous catheter. No active cardiopulmonary disease. Electronically Signed   By: Ilona Sorrel M.D.   On: 06/16/2015 09:58    Medications / Allergies:  Scheduled Meds: . sodium chloride   Intravenous Once  . antiseptic oral rinse  7 mL Mouth Rinse QID  . brimonidine  1 drop Both Eyes BID  . budesonide  0.25 mg Nebulization BID  . chlorhexidine gluconate  15 mL Mouth Rinse BID  . enoxaparin (LOVENOX) injection  40 mg Subcutaneous Q24H  . fluconazole (DIFLUCAN) IV  100 mg Intravenous Q24H  . insulin aspart  0-9 Units Subcutaneous 6 times per day  . ipratropium-albuterol  3 mL Nebulization BID  . latanoprost  1 drop Both Eyes QHS  . LORazepam  0.5 mg Intravenous Once  . pantoprazole (PROTONIX) IV  40 mg Intravenous Q12H  . piperacillin-tazobactam (ZOSYN)  IV  3.375 g Intravenous Q8H  . potassium chloride  10 mEq Intravenous Q1  Hr x 3   Continuous Infusions: . dextrose 5 % and 0.45% NaCl 125 mL/hr at 06/17/15 1935   PRN Meds:.acetaminophen, HYDROmorphone (DILAUDID) injection, Influenza vac split quadrivalent PF, ipratropium-albuterol, oxyCODONE-acetaminophen, phenol, phenol, pneumococcal 23 valent vaccine, sodium chloride, white petrolatum, zolpidem  Antibiotics: Anti-infectives    Start     Dose/Rate Route Frequency Ordered Stop   06/17/15 1600  piperacillin-tazobactam (ZOSYN) IVPB 3.375 g     3.375 g 12.5 mL/hr over 240 Minutes Intravenous Every 8 hours 06/17/15 1413     06/15/15 0900  fluconazole (DIFLUCAN) IVPB 100 mg     100 mg 50 mL/hr over 60 Minutes Intravenous Every 24 hours 06/15/15 0755     06/14/15 1600  [MAR Hold]  piperacillin-tazobactam (ZOSYN) IVPB 3.375 g  Status:  Discontinued     (MAR Hold since 06/14/15 1040)   3.375 g 12.5 mL/hr over 240 Minutes Intravenous Every 8 hours 06/14/15 0907 06/14/15 1122   06/14/15 1400  piperacillin-tazobactam (ZOSYN) IVPB 2.25 g  Status:  Discontinued     2.25 g 100  mL/hr over 30 Minutes Intravenous 3 times per day 06/14/15 1122 06/17/15 1413   06/14/15 0900  piperacillin-tazobactam (ZOSYN) IVPB 3.375 g     3.375 g 100 mL/hr over 30 Minutes Intravenous  Once 06/14/15 0857 06/14/15 1039       Assessment/Plan POD 4, s/p ex lap with repair of perforated gastric ulcer with graham patch---Dr. [place] Silos 06/14/15 -DC NGT and give clears -needs to mobilize, PT eval -cont pulm toileting -BID WD dressing changes to midline wound -cont JP drain for now, until eating diet FEN-clears, DC PCA, add PO and IV pain meds ID-zosyn/diflucan D#4.  Culture with few coccobacillus.  ABL anemia-s/p blood tx, labs pending AKI-good UOP,improving sCr.  DC foley to aid in mobilization.  DVT proph-SCDs/Lovenox  Erby Pian, [facility] Pager [phone]) For consults and floor pages call [phone](7A-4:30P)  06/18/2015 9:27 AM

## 2015-06-18 NOTE — Progress Notes (Signed)
Pt given PRN Percocet prior to dressing change, pt tolerated dressing well

## 2015-06-18 NOTE — Progress Notes (Signed)
Physical Therapy Treatment Patient Details Name: Colleen [name] MRN: [id number] DOB: 01/21/1951 Today's Date: 06/18/2015    History of Present Illness pt is a 64 y/o female with h/o COPD, admitted with acute abd pain and dyspnea due to pneumoperitoneum, s/p emergent exploratory lap for ruptured gastric ulcer.    PT Comments    Making progress with mobility and activity tolerance  Follow Up Recommendations  SNF     Equipment Recommendations  Other (comment) (TBA)    Recommendations for Other Services       Precautions / Restrictions Precautions Precautions: Fall    Mobility  Bed Mobility Overal bed mobility: Needs Assistance Bed Mobility: Rolling;Sidelying to Sit Rolling: Supervision Sidelying to sit: Min guard       General bed mobility comments: cues to log roll for activity tolerance; used rails  Transfers Overall transfer level: Needs assistance Equipment used: Rolling walker (2 wheeled) Transfers: Sit to/from Stand Sit to Stand: Supervision         General transfer comment: cues for hand placement  Ambulation/Gait Ambulation/Gait assistance: Min guard Ambulation Distance (Feet): 100 Feet Assistive device: Rolling walker (2 wheeled) Gait Pattern/deviations: Step-through pattern;Trunk flexed Gait velocity: slower   General Gait Details: short tentative steps than improved mildly with distance.   Stairs            Wheelchair Mobility    Modified Rankin (Stroke Patients Only)       Balance                                    Cognition Arousal/Alertness: Awake/alert Behavior During Therapy: WFL for tasks assessed/performed Overall Cognitive Status: Within Functional Limits for tasks assessed                      Exercises      General Comments        Pertinent Vitals/Pain Pain Assessment: Faces Faces Pain Scale: Hurts whole lot Pain Location: Abdominal pain with attepts at estending trunk in  standing Pain Descriptors / Indicators: Grimacing Pain Intervention(s): Limited activity within patient's tolerance;Monitored during session    Home Living                      Prior Function            PT Goals (current goals can now be found in the care plan section) Acute Rehab PT Goals Patient Stated Goal: back home, packed, [facility] PT Goal Formulation: With patient Time For Goal Achievement: 06/30/15 Potential to Achieve Goals: Good Progress towards PT goals: Progressing toward goals    Frequency  Min 3X/week    PT Plan Current plan remains appropriate    Co-evaluation             End of Session   Activity Tolerance: Patient tolerated treatment well Patient left: in chair;with call bell/phone within reach     Time: 1440-1502 PT Time Calculation (min) (ACUTE ONLY): 22 min  Charges:  $Gait Training: 8-22 mins                    G Codes:      Olen PelGarrigan, Narjis Mira Hamff 06/18/2015, 4:25 PM  Van ClinesHolly Damyiah Moxley, [place]  Acute Rehabilitation Services Pager [phone] Office [phone]

## 2015-06-18 NOTE — Progress Notes (Signed)
K+ was 3.3 MD notified and ordered 10 mEq IV k+

## 2015-06-18 NOTE — Care Management Note (Signed)
Case Management Note  Patient Details  Name: Marlou StarksYasmin H Mcgann MRN: [id number] Date of Birth: 04/30/1951  Subjective/Objective:                    Action/Plan:   Expected Discharge Date:                  Expected Discharge Plan:  Skilled Nursing Facility  In-House Referral:  Clinical Social Work  Discharge planning Services     Post Acute Care Choice:    Choice offered to:     DME Arranged:    DME Agency:     HH Arranged:    HH Agency:     Status of Service:  In process, will continue to follow  Medicare Important Message Given:    Date Medicare IM Given:    Medicare IM give by:    Date Additional Medicare IM Given:    Additional Medicare Important Message give by:     If discussed at Long Length of Stay Meetings, dates discussed:    Additional Comments:UR updated   Kingsley PlanWile, Hulan Szumski Marie, RN 06/18/2015, 10:00 AM

## 2015-06-18 NOTE — Progress Notes (Signed)
[facility] PROGRESS NOTE  Colleen [name] WUJ:[id number] DOB: 04/28/1951 DOA: 06/14/2015  PCP: No primary care provider on file.  Brief HPI: 64 year old African-American female with a past medical history of COPD presented with acute abdominal pain and dyspnea. Evaluation revealed pneumopericardium. She was found to have a perforated gastric ulcer when she underwent exploratory laparotomy.  Past medical history:  Past Medical History  Diagnosis Date  . COPD (chronic obstructive pulmonary disease) (HCC)   . Hypertension   . Asthma   . Emphysema/COPD [facility](HCC)     Consultants:  Gen surgery Admitted by CCM  Procedures:  EXPLORATORY LAPAROTOMY GASTRIC BIOPSY REPAIR PERFORATED GASTRIC ULCER Intubation   Antibiotics: Currently on Zosyn and fluconazole  Subjective: Patient denies any nausea, vomiting. Still has upper abdominal pain. Passing gas from below. Denies any difficulty breathing.  Objective: Vital Signs  Filed Vitals:   06/18/15 0400 06/18/15 0500 06/18/15 0833 06/18/15 0847  BP:  143/79    Pulse:  115 100   Temp:  97.9 F (36.6 C)    TempSrc:  Oral    Resp: 14 14 16 18   Height:      Weight:  51.075 kg (112 lb 9.6 oz)    SpO2: 100% 100% 100% 100%    Intake/Output Summary (Last 24 hours) at 06/18/15 0944 Last data filed at 06/18/15 0855  Gross per 24 hour  Intake 107784.59 ml  Output   3705 ml  Net 104079.59 ml   Filed Weights   06/15/15 0400 06/17/15 0419 06/18/15 0500  Weight: 46.9 kg (103 lb 6.3 oz) 50.531 kg (111 lb 6.4 oz) 51.075 kg (112 lb 9.6 oz)    General appearance: alert, cooperative, appears stated age and no distress Resp: diminished air entry at the bases without any crackles, wheezing. Cardio: regular rate and rhythm, S1, S2 normal, no murmur, click, rub or gallop GI: soft, non-tender; bowel sounds normal; no masses,  no organomegaly Extremities: extremities normal, atraumatic, no cyanosis or edema Neurologic: Alert and oriented  X 3, no focal deficits  Lab Results:  Basic Metabolic Panel:  Recent Labs Lab 06/15/15 1431 06/15/15 2324 06/16/15 0437 06/17/15 0830 06/18/15 0441  NA 137 137 138 136 131*  K 4.2 3.6 3.6 3.3* 3.3*  CL 99* 100* 101 100* 98*  CO2 28 27 26 24 23   GLUCOSE 82 102* 98 118* 124*  BUN 58* 51* 47* 22* 14  CREATININE 2.75* 2.50* 2.27* 1.50* 1.29*  CALCIUM 6.8* 6.7* 6.8* 8.5* 8.4*  MG  --   --  1.2* 1.4* 1.1*  PHOS  --   --  5.6* 4.2 3.3   Liver Function Tests:  Recent Labs Lab 06/14/15 0823  AST 27  ALT 13*  ALKPHOS 77  BILITOT 0.4  PROT 7.5  ALBUMIN 3.7   CBC:  Recent Labs Lab 06/14/15 0823 06/15/15 0512 06/15/15 1131 06/16/15 0437 06/17/15 0830 06/18/15 0441  WBC 2.7* 10.5 10.3 10.9* 12.3* 12.8*  NEUTROABS 1.8  --   --   --   --   --   HGB 12.4 7.0* 7.1* 6.7* 9.6* 10.3*  HCT 37.8 21.1* 21.2* 20.2* 27.9* 30.8*  MCV 85.3 83.7 81.9 82.4 80.6 80.0  PLT 544* 314 319 338 301 314   Cardiac Enzymes:  Recent Labs Lab 06/14/15 0823  TROPONINI <0.03   BNP (last 3 results)  Recent Labs  06/14/15 0823  BNP 29.8    CBG:  Recent Labs Lab 06/17/15 1608 06/17/15 2017 06/18/15 0018 06/18/15 0433 06/18/15  0745  GLUCAP 103* 99 109* 134* 108*    Recent Results (from the past 240 hour(s))  Blood Culture (routine x 2)     Status: None (Preliminary result)   Collection Time: 06/14/15  9:10 AM  Result Value Ref Range Status   Specimen Description BLOOD LEFT ANTECUBITAL  Final   Special Requests BOTTLES DRAWN AEROBIC AND ANAEROBIC 5CC  Final   Culture NO GROWTH 3 DAYS  Final   Report Status PENDING  Incomplete  Blood Culture (routine x 2)     Status: None (Preliminary result)   Collection Time: 06/14/15  9:25 AM  Result Value Ref Range Status   Specimen Description BLOOD LEFT ANTECUBITAL  Final   Special Requests IN PEDIATRIC BOTTLE 5CC  Final   Culture NO GROWTH 3 DAYS  Final   Report Status PENDING  Incomplete  Anaerobic culture     Status: None  (Preliminary result)   Collection Time: 06/14/15 11:57 AM  Result Value Ref Range Status   Specimen Description FLUID ABDOMEN A  Final   Special Requests PATIENT ON FOLLOWING ZOSYN  Final   Gram Stain   Final    RARE WBC PRESENT, PREDOMINANTLY PMN FEW GRAM POSITIVE COCCOBACILLUS    Culture   Final    NO ANAEROBES ISOLATED; CULTURE IN PROGRESS FOR 5 DAYS   Report Status PENDING  Incomplete  Body fluid culture     Status: None (Preliminary result)   Collection Time: 06/14/15  1:04 PM  Result Value Ref Range Status   Specimen Description FLUID ABDOMEN A  Final   Special Requests PATIENT ON FOLLOWING ZOSYN  Final   Gram Stain   Final    RARE WBC PRESENT, PREDOMINANTLY PMN FEW GRAM POSITIVE COCCOBACILLUS    Culture NO GROWTH 3 DAYS  Final   Report Status PENDING  Incomplete  Urine culture     Status: None   Collection Time: 06/14/15  2:30 PM  Result Value Ref Range Status   Specimen Description URINE, CATHETERIZED  Final   Special Requests NONE  Final   Culture NO GROWTH 2 DAYS  Final   Report Status 06/16/2015 FINAL  Final  MRSA PCR Screening     Status: None   Collection Time: 06/14/15  8:05 PM  Result Value Ref Range Status   MRSA by PCR NEGATIVE NEGATIVE Final    Comment:        The GeneXpert MRSA Assay (FDA approved for NASAL specimens only), is one component of a comprehensive MRSA colonization surveillance program. It is not intended to diagnose MRSA infection nor to guide or monitor treatment for MRSA infections.       Studies/Results: Dg Chest Port 1 View  06/16/2015  CLINICAL DATA:  COPD.  Postoperative. EXAM: PORTABLE CHEST 1 VIEW COMPARISON:  06/14/2015 chest radiograph. FINDINGS: Surgical hardware overlies the lower cervical spine. Right internal jugular central venous catheter terminates in the lower third of the superior vena cava. Enteric tube enters the stomach, with the tip not seen on this image. Interval extubation. Stable cardiomediastinal silhouette  with normal heart size. No pneumothorax. No pleural effusion. Clear lungs, with no focal lung consolidation and no pulmonary edema. Right proximal humeral prosthesis. IMPRESSION: Well-positioned enteric tube and right IJ central venous catheter. No active cardiopulmonary disease. Electronically Signed   By: Delbert Phenix M.D.   On: 06/16/2015 09:58    Medications:  Scheduled: . sodium chloride   Intravenous Once  . antiseptic oral rinse  7 mL Mouth Rinse  QID  . brimonidine  1 drop Both Eyes BID  . budesonide  0.25 mg Nebulization BID  . chlorhexidine gluconate  15 mL Mouth Rinse BID  . enoxaparin (LOVENOX) injection  40 mg Subcutaneous Q24H  . fluconazole (DIFLUCAN) IV  100 mg Intravenous Q24H  . insulin aspart  0-9 Units Subcutaneous 6 times per day  . ipratropium-albuterol  3 mL Nebulization BID  . latanoprost  1 drop Both Eyes QHS  . LORazepam  0.5 mg Intravenous Once  . metoprolol  2.5 mg Intravenous 3 times per day  . pantoprazole (PROTONIX) IV  40 mg Intravenous Q12H  . piperacillin-tazobactam (ZOSYN)  IV  3.375 g Intravenous Q8H  . potassium chloride  10 mEq Intravenous Q1 Hr x 4   Continuous: . dextrose 5 % and 0.45% NaCl 100 mL/hr at 06/18/15 1478   GNF:AOZHYQMVHQION, hydrALAZINE, HYDROmorphone (DILAUDID) injection, Influenza vac split quadrivalent PF, ipratropium-albuterol, oxyCODONE-acetaminophen, phenol, phenol, pneumococcal 23 valent vaccine, sodium chloride, white petrolatum, zolpidem  Assessment/Plan:  Active Problems:   Perforated ulcer (HCC)   Acute respiratory failure (HCC)    Perforated gastric ulcer status post repair with graham patch (Dr. Janee Morn on 11/12) General surgery is following and managing for the most part. Continue Zosyn for now. Defer antibiotics to general surgery. Pathology report showed that the ulcer was benign.  Acute respiratory failure, underlying COPD Currently stable. Extubated on 11/13  Acute kidney injury; likely due to sepsis from  #1, dehydration Improving, continue gentle hydration   History of COPD Currently stable, no wheezing. Was successfully extubated on 11/13. Incentive spirometry.  Sepsis secondary to peritonitis from perforated gastric ulcer Currently stable, continue Zosyn. Influenza negative, urine culture negative, blood cultures negative.  Normocytic Anemia Hemoglobin was 6.7 on 11/14, received 2 units packed RBC. Now stable.  Hypokalemia and hypomagnesemia Replace both.   DVT Prophylaxis: Lovenox    Code Status: full code  Family Communication: discussed with patient Disposition Plan: start mobilizing.    LOS: 4 days   [facility]  [facility] Pager [phone] 06/18/2015, 9:44 AM  If 7PM-7AM, please contact night-coverage at [email or web address], password [facility]

## 2015-06-18 NOTE — NC FL2 (Signed)
[place] MEDICAID FL2 LEVEL OF CARE SCREENING TOOL     IDENTIFICATION  Patient Name: Colleen [name] Birthdate: March 30, 1951 Sex: female Admission Date (Current Location): 06/14/2015  [facility] [place] and [place] Number: Producer, television/film/video and Address:  [employer], [address], [place], [place] [address]      Provider Number: [id number]  Attending Physician Name and Address:  Osvaldo Shipper, MD  Relative Name and Phone Number:  Orson Eva  [phone]    Current Level of Care: Hospital Recommended Level of Care: Skilled Nursing Facility Prior Approval Number:    Date Approved/Denied:   PASRR Number: [id number]  Discharge Plan: SNF    Current Diagnoses: Patient Active Problem List   Diagnosis Date Noted  . Perforated ulcer (HCC) 06/14/2015  . Acute respiratory failure (HCC)   . HYPERTENSION 03/05/2009  . ATRIAL FIBRILLATION 03/05/2009  . EMPHYSEMA 03/05/2009  . ASTHMA 03/05/2009  . C O P D 03/05/2009  . Nonspecific (abnormal) findings on radiological and other examination of body structure 03/05/2009  . CT, CHEST, ABNORMAL 03/05/2009    Orientation ACTIVITIES/SOCIAL BLADDER RESPIRATION    Self, Time, Situation, Place    Continent Normal  BEHAVIORAL SYMPTOMS/MOOD NEUROLOGICAL BOWEL NUTRITION STATUS      Continent Diet (Clear Liquids)  PHYSICIAN VISITS COMMUNICATION OF NEEDS Height & Weight Skin    Verbally 5\' 6"  (167.6 cm) 112 lbs. Surgical wounds          AMBULATORY STATUS RESPIRATION    Supervision limited Normal      Personal Care Assistance Level of Assistance  Bathing, Dressing Bathing Assistance: Limited assistance   Dressing Assistance: Limited assistance      Functional Limitations Info                SPECIAL CARE FACTORS FREQUENCY  PT (By licensed PT)     PT Frequency: 5x per week             Additional Factors Info  Allergies, Code Status Code Status Info: Full Allergies Info: CODEINE            Current Medications (06/18/2015): Current Facility-Administered Medications  Medication Dose Route Frequency Provider Last Rate Last Dose  . 0.9 %  sodium chloride infusion   Intravenous Once Axel Filler, MD   Stopped at 06/17/15 1704  . acetaminophen (TYLENOL) tablet 325-650 mg  325-650 mg Oral Q6H PRN Emina Riebock, NP      . antiseptic oral rinse solution (CORINZ)  7 mL Mouth Rinse QID Simonne Martinet, NP   7 mL at 06/18/15 1534  . brimonidine (ALPHAGAN) 0.2 % ophthalmic solution 1 drop  1 drop Both Eyes BID Lonia Blood, MD   1 drop at 06/18/15 1134  . budesonide (PULMICORT) nebulizer solution 0.25 mg  0.25 mg Nebulization BID Kalman Shan, MD   0.25 mg at 06/18/15 0832  . chlorhexidine gluconate (PERIDEX) 0.12 % solution 15 mL  15 mL Mouth Rinse BID Simonne Martinet, NP   15 mL at 06/18/15 1134  . dextrose 5 %-0.45 % sodium chloride infusion   Intravenous Continuous Osvaldo Shipper, MD 100 mL/hr at 06/18/15 0949    . enoxaparin (LOVENOX) injection 40 mg  40 mg Subcutaneous Q24H Herby Abraham, RPH   40 mg at 06/18/15 1031  . fluconazole (DIFLUCAN) IVPB 100 mg  100 mg Intravenous Q24H Axel Filler, MD   100 mg at 06/18/15 1135  . hydrALAZINE (APRESOLINE) injection 10 mg  10 mg  Intravenous Q6H PRN Osvaldo Shipper, MD      . HYDROmorphone (DILAUDID) injection 1 mg  1 mg Intravenous Q2H PRN Ashok Norris, NP      . Influenza vac split quadrivalent PF (FLUARIX) injection 0.5 mL  0.5 mL Intramuscular Prior to discharge Violeta Gelinas, MD      . insulin aspart (novoLOG) injection 0-9 Units  0-9 Units Subcutaneous 6 times per day Simonne Martinet, NP   1 Units at 06/18/15 0442  . ipratropium-albuterol (DUONEB) 0.5-2.5 (3) MG/3ML nebulizer solution 3 mL  3 mL Nebulization BID Alyson Reedy, MD   3 mL at 06/18/15 0832  . ipratropium-albuterol (DUONEB) 0.5-2.5 (3) MG/3ML nebulizer solution 3 mL  3 mL Nebulization Once PRN Leanne Chang, NP      . latanoprost (XALATAN) 0.005  % ophthalmic solution 1 drop  1 drop Both Eyes QHS Lonia Blood, MD   1 drop at 06/17/15 2150  . LORazepam (ATIVAN) injection 0.5 mg  0.5 mg Intravenous Once Leanne Chang, NP      . metoprolol (LOPRESSOR) injection 2.5 mg  2.5 mg Intravenous 3 times per day Osvaldo Shipper, MD   2.5 mg at 06/18/15 1141  . oxyCODONE-acetaminophen (PERCOCET/ROXICET) 5-325 MG per tablet 1-2 tablet  1-2 tablet Oral Q4H PRN Ashok Norris, NP   2 tablet at 06/18/15 1544  . pantoprazole (PROTONIX) injection 40 mg  40 mg Intravenous Q12H Violeta Gelinas, MD   40 mg at 06/18/15 1031  . phenol (CHLORASEPTIC) mouth spray 1 spray  1 spray Mouth/Throat PRN Violeta Gelinas, MD   1 spray at 06/18/15 [phone]  . phenol (CHLORASEPTIC) mouth spray 1 spray  1 spray Mouth/Throat PRN Roma Kayser Schorr, NP      . piperacillin-tazobactam (ZOSYN) IVPB 3.375 g  3.375 g Intravenous Q8H Herby Abraham, RPH   3.375 g at 06/18/15 1534  . pneumococcal 23 valent vaccine (PNU-IMMUNE) injection 0.5 mL  0.5 mL Intramuscular Prior to discharge Violeta Gelinas, MD      . sodium chloride 0.9 % injection 10-40 mL  10-40 mL Intracatheter PRN Lonia Blood, MD      . white petrolatum (VASELINE) gel   Topical PRN Everlene Farrier, PA-C      . zolpidem (AMBIEN) tablet 5 mg  5 mg Oral QHS PRN Leanne Chang, NP   5 mg at 06/17/15 2244   Do not use this list as official medication orders. Please verify with discharge summary.  Discharge Medications:   Medication List    ASK your doctor about these medications        ADVAIR DISKUS 250-50 MCG/DOSE Aepb  Generic drug:  Fluticasone-Salmeterol  Inhale 2 puffs into the lungs 2 (two) times daily as needed.     ALPHAGAN P 0.1 % Soln  Generic drug:  brimonidine  Place 1 drop into both eyes 2 (two) times daily.     BYSTOLIC 5 MG tablet  Generic drug:  nebivolol  Take 5 mg by mouth daily.     chlorpheniramine-HYDROcodone 10-8 MG/5ML Suer  Commonly known as:  TUSSIONEX  Take 5 mLs by mouth as  needed. 1 teaspoon as needed for cough     clarithromycin 500 MG 24 hr tablet  Commonly known as:  BIAXIN XL  Take 1,000 mg by mouth daily.     cyclobenzaprine 10 MG tablet  Commonly known as:  FLEXERIL  Take 10 mg by mouth as needed. Moderate pain     fluconazole 200 MG  tablet  Commonly known as:  DIFLUCAN  Take 1 tablet by mouth every other day.     furosemide 40 MG tablet  Commonly known as:  LASIX  Take 40 mg by mouth as needed.     latanoprost 0.005 % ophthalmic solution  Commonly known as:  XALATAN  Place 1 drop into both eyes. 1 drop in both eyes once daily at bed time     levofloxacin 500 MG tablet  Commonly known as:  LEVAQUIN  Take 500 mg by mouth daily.     meloxicam 15 MG tablet  Commonly known as:  MOBIC  Take 1 tablet by mouth as needed. Take one tablet by mouth once daily with food for 14 days then take 1 daily as needed.     metroNIDAZOLE 0.75 % cream  Commonly known as:  METROCREAM     PROAIR HFA 108 (90 BASE) MCG/ACT inhaler  Generic drug:  albuterol  Inhale 1-2 puffs into the lungs every 4 (four) hours as needed.     sertraline 100 MG tablet  Commonly known as:  ZOLOFT  Take 100 mg by mouth daily.     zolpidem 10 MG tablet  Commonly known as:  AMBIEN  Take 1 tablet by mouth at bedtime as needed.        Relevant Imaging Results:  Relevant Lab Results:  Recent Labs    Additional Information    Anaih Brander, Ervin Knack, LCSWA

## 2015-06-18 NOTE — Progress Notes (Signed)
PCA discontinued per MD order.  Wasted 13mL Hydromorphone 25mg /4525mL syringe, in Pyxis A sink. Witness Gregery NaLanisha Hunter, RN.

## 2015-06-18 NOTE — Clinical Social Work Note (Signed)
Clinical Social Work Assessment  Patient Details  Name: Colleen [name] MRN: [id number] Date of Birth: 10/02/1950  Date of referral:  06/17/15               Reason for consult:  Facility Placement                Permission sought to share information with:  Facility Industrial/product designerContact Representative Permission granted to share information::  Yes, Verbal Permission Granted  Name::        Agency::  SNF admissions  Relationship::     Contact Information:     Housing/Transportation Living arrangements for the past 2 months:  Single Family Home Source of Information:  Patient Patient Interpreter Needed:  None Criminal Activity/Legal Involvement Pertinent to Current Situation/Hospitalization:  No - Comment as needed Significant Relationships:  Other Family Members Lives with:  Friends Do you feel safe going back to the place where you live?  Yes (Patient feels she needs some short term rehab before she can return back home.) Need for family participation in patient care:  No (Coment)  Care giving concerns:  Patient feels she needs some short term rehab before she can return back home.   Social Worker assessment / plan:  Patient is a 64 year old female who lives with a friend.  Patient is alert and oriented x4 and pleasant to talk to.  Patient expressed that she is feeling much better and is looking forward to getting well enough to go to SNF and then return home.  Patient does not have any family locally, she [place], and patient plans [place] [facility] [place] [facility] [place] once she is well enough to go there.  Patient states she has been to rehab before, and did not have any questions or concerns.  CSW explained process for looking for SNF placement and patient expressed she is hoping to get well enough soon to discharge to SNF and continue getting better.  Employment status:  Retired Health and safety inspectornsurance information:  Managed Care PT Recommendations:  Skilled Nursing Facility Information /  Referral to community resources:  Skilled Nursing Facility  Patient/Family's Response to care:  Patient in agreement to going to SNF for short term rehab.  Patient/Family's Understanding of and Emotional Response to Diagnosis, Current Treatment, and Prognosis:  Patient aware of current diagnosis and treatment plan.  Emotional Assessment Appearance:  Appears stated age Attitude/Demeanor/Rapport:    Affect (typically observed):  Pleasant, Appropriate, Calm, Stable, Happy Orientation:  Oriented to Self, Oriented to Place, Oriented to  Time, Oriented to Situation Alcohol / Substance use:  Not Applicable Psych involvement (Current and /or in the community):  No (Comment)  Discharge Needs  Concerns to be addressed:  Lack of Support Readmission within the last 30 days:  No Current discharge risk:  Lack of support system Barriers to Discharge:  Insurance Authorization   [place] [employer], [name], LCSWA 06/18/2015, 7:51 PM

## 2015-06-18 NOTE — Progress Notes (Signed)
Pt a/o, on PCA pump, pt BP was 174/91 @ 2108, pt stated that she runs high and hasn't been on her home BP meds, at 2300 pt c/o HA and was diaphoretic and states she's been getting like this since she has been here, MD notified and 1x Hydralazine ordered and BP came down to 133/81, At around midnight pt was very anxious and asked to get up, pt was still diaphoretic and stated she couldn't breath and wanted a treatment, pt at the time did not have any PRN breathing treatment ordered in MD notified and ordered 1z ativan and breathing tx, pt asked staff to hand her her bag and pt took home inhaler, pt was told that she is not allowed to take home medications while in the hospital d/t policy and explained that home meds may interfere with current treatment and we can not document what she takes, pt apologized and stated "I just didn't want to have an asthma attack, I won't do it again" staff asked pt if someone was able to take meds home and she stated there was.  Pt then got washed up and linens changed, pt now resting comfortably, RN brought IV Ativan in to pt and pt stated she felt better and did not want to take it but then c/o thrush, pt has whitish film on tongue and pt stated she had some pain MD notified

## 2015-06-18 NOTE — Clinical Social Work Placement (Signed)
CLINICAL SOCIAL WORK PLACEMENT  NOTE  Date:  06/18/2015  Patient Details  Name: Colleen [name] MRN: [id number] Date of Birth: Apr 28, 1951  Clinical Social Work is seeking post-discharge placement for this patient at the Skilled  Nursing Facility level of care (*CSW will initial, date and re-position this form in  chart as items are completed):  Yes   Patient/family provided with [facility] Clinical Social Work Department's list of facilities offering this level of care within the geographic area requested by the patient (or if unable, by the patient's family).  Yes   Patient/family informed of their freedom to choose among providers that offer the needed level of care, that participate in Medicare, Medicaid or managed care program needed by the patient, have an available bed and are willing to accept the patient.  Yes   Patient/family informed of [place]'s ownership interest in [facility] and [facility], as well as of the fact that they are under no obligation to receive care at these facilities.  PASRR submitted to EDS on 06/18/15     PASRR number received on 06/18/15     Existing PASRR number confirmed on       FL2 transmitted to all facilities in geographic area requested by pt/family on 06/18/15     FL2 transmitted to all facilities within larger geographic area on       Patient informed that his/her managed care company has contracts with or will negotiate with certain facilities, including the following:            Patient/family informed of bed offers received.  Patient chooses bed at       Physician recommends and patient chooses bed at      Patient to be transferred to   on  .  Patient to be transferred to facility by       Patient family notified on   of transfer.  Name of family member notified:        PHYSICIAN Please sign FL2     Additional Comment:    _______________________________________________ Darleene Cleaver, LCSWA 06/18/2015,  7:57 PM

## 2015-06-19 DIAGNOSIS — D649 Anemia, unspecified: Secondary | ICD-10-CM

## 2015-06-19 LAB — CULTURE, BLOOD (ROUTINE X 2)
CULTURE: NO GROWTH
Culture: NO GROWTH

## 2015-06-19 LAB — ANAEROBIC CULTURE

## 2015-06-19 LAB — TYPE AND SCREEN
ABO/RH(D): A POS
ANTIBODY SCREEN: NEGATIVE
UNIT DIVISION: 0
Unit division: 0

## 2015-06-19 LAB — CBC
HEMATOCRIT: 26 % — AB (ref 36.0–46.0)
Hemoglobin: 8.9 g/dL — ABNORMAL LOW (ref 12.0–15.0)
MCH: 27.5 pg (ref 26.0–34.0)
MCHC: 34.2 g/dL (ref 30.0–36.0)
MCV: 80.2 fL (ref 78.0–100.0)
PLATELETS: 236 10*3/uL (ref 150–400)
RBC: 3.24 MIL/uL — AB (ref 3.87–5.11)
RDW: 15.5 % (ref 11.5–15.5)
WBC: 10 10*3/uL (ref 4.0–10.5)

## 2015-06-19 LAB — BASIC METABOLIC PANEL
ANION GAP: 7 (ref 5–15)
BUN: 14 mg/dL (ref 6–20)
CALCIUM: 8.3 mg/dL — AB (ref 8.9–10.3)
CO2: 21 mmol/L — ABNORMAL LOW (ref 22–32)
Chloride: 102 mmol/L (ref 101–111)
Creatinine, Ser: 1.32 mg/dL — ABNORMAL HIGH (ref 0.44–1.00)
GFR, EST AFRICAN AMERICAN: 49 mL/min — AB (ref >60)
GFR, EST NON AFRICAN AMERICAN: 42 mL/min — AB (ref >60)
Glucose, Bld: 96 mg/dL (ref 65–99)
POTASSIUM: 3.6 mmol/L (ref 3.5–5.1)
Sodium: 130 mmol/L — ABNORMAL LOW (ref 135–145)

## 2015-06-19 LAB — MAGNESIUM: MAGNESIUM: 1.6 mg/dL — AB (ref 1.7–2.4)

## 2015-06-19 LAB — GLUCOSE, CAPILLARY
GLUCOSE-CAPILLARY: 105 mg/dL — AB (ref 65–99)
Glucose-Capillary: 100 mg/dL — ABNORMAL HIGH (ref 65–99)

## 2015-06-19 MED ORDER — PANTOPRAZOLE SODIUM 40 MG PO TBEC
40.0000 mg | DELAYED_RELEASE_TABLET | Freq: Two times a day (BID) | ORAL | Status: DC
  Administered 2015-06-19 – 2015-06-20 (×2): 40 mg via ORAL
  Filled 2015-06-19 (×2): qty 1

## 2015-06-19 MED ORDER — POTASSIUM CHLORIDE 10 MEQ/100ML IV SOLN
10.0000 meq | INTRAVENOUS | Status: AC
  Administered 2015-06-19 (×4): 10 meq via INTRAVENOUS
  Filled 2015-06-19: qty 100

## 2015-06-19 MED ORDER — MAGNESIUM SULFATE IN D5W 10-5 MG/ML-% IV SOLN
1.0000 g | Freq: Once | INTRAVENOUS | Status: AC
  Administered 2015-06-19: 1 g via INTRAVENOUS
  Filled 2015-06-19: qty 100

## 2015-06-19 NOTE — Progress Notes (Signed)
[facility] PROGRESS NOTE  Colleen [name] ZOX:[id number] DOB: 01/04/1951 DOA: 06/14/2015  PCP: No primary care provider on file.  Brief HPI: 64 year old African-American female with a past medical history of COPD presented with acute abdominal pain and dyspnea. Evaluation revealed pneumopericardium. She was found to have a perforated gastric ulcer when she underwent exploratory laparotomy.  Past medical history:  Past Medical History  Diagnosis Date  . COPD (chronic obstructive pulmonary disease) (HCC)   . Hypertension   . Asthma   . Emphysema/COPD (HCC)   . Heart murmur   . Shortness of breath dyspnea   . Depression   . GERD (gastroesophageal reflux disease)   . Headache   . Arthritis     Consultants:  Gen surgery Admitted by CCM  Procedures:  EXPLORATORY LAPAROTOMY GASTRIC BIOPSY REPAIR PERFORATED GASTRIC ULCER Intubation   Antibiotics: Currently on Zosyn and fluconazole  Subjective: Patient feels well. He tolerated okay liquids. Pain is reasonably well controlled. Had small bowel movements today.  Objective: Vital Signs  Filed Vitals:   06/18/15 1430 06/18/15 2038 06/18/15 2116 06/19/15 0446  BP: 142/80  146/81 164/94  Pulse: 110  104 85  Temp: 98.6 F (37 C)  97.6 F (36.4 C) 97.9 F (36.6 C)  TempSrc: Oral  Oral Oral  Resp: 18  16 16   Height:      Weight:      SpO2: 100% 100% 100% 99%    Intake/Output Summary (Last 24 hours) at 06/19/15 0933 Last data filed at 06/19/15 [id number]  Gross per 24 hour  Intake 2249.16 ml  Output     90 ml  Net 2159.16 ml   Filed Weights   06/15/15 0400 06/17/15 0419 06/18/15 0500  Weight: 46.9 kg (103 lb 6.3 oz) 50.531 kg (111 lb 6.4 oz) 51.075 kg (112 lb 9.6 oz)    General appearance: alert, cooperative, appears stated age and no distress Resp: diminished air entry at the bases without any crackles, wheezing. Cardio: regular rate and rhythm, S1, S2 normal, no murmur, click, rub or gallop GI: soft,  non-tender; bowel sounds normal; no masses,  no organomegaly. Covered with dressing Neurologic: Alert and oriented X 3, no focal deficits  Lab Results:  Basic Metabolic Panel:  Recent Labs Lab 06/15/15 2324 06/16/15 0437 06/17/15 0830 06/18/15 0441 06/19/15 0607  NA 137 138 136 131* 130*  K 3.6 3.6 3.3* 3.3* 3.6  CL 100* 101 100* 98* 102  CO2 27 26 24 23  21*  GLUCOSE 102* 98 118* 124* 96  BUN 51* 47* 22* 14 14  CREATININE 2.50* 2.27* 1.50* 1.29* 1.32*  CALCIUM 6.7* 6.8* 8.5* 8.4* 8.3*  MG  --  1.2* 1.4* 1.1* 1.6*  PHOS  --  5.6* 4.2 3.3  --    Liver Function Tests:  Recent Labs Lab 06/14/15 0823  AST 27  ALT 13*  ALKPHOS 77  BILITOT 0.4  PROT 7.5  ALBUMIN 3.7   CBC:  Recent Labs Lab 06/14/15 0823  06/15/15 1131 06/16/15 0437 06/17/15 0830 06/18/15 0441 06/19/15 0607  WBC 2.7*  < > 10.3 10.9* 12.3* 12.8* 10.0  NEUTROABS 1.8  --   --   --   --   --   --   HGB 12.4  < > 7.1* 6.7* 9.6* 10.3* 8.9*  HCT 37.8  < > 21.2* 20.2* 27.9* 30.8* 26.0*  MCV 85.3  < > 81.9 82.4 80.6 80.0 80.2  PLT 544*  < > 319 338 301 314 236  < > =  values in this interval not displayed. Cardiac Enzymes:  Recent Labs Lab 06/14/15 0823  TROPONINI <0.03   BNP (last 3 results)  Recent Labs  06/14/15 0823  BNP 29.8    CBG:  Recent Labs Lab 06/18/15 1727 06/18/15 2016 06/18/15 2329 06/19/15 0442 06/19/15 0736  GLUCAP 83 93 90 100* 105*    Recent Results (from the past 240 hour(s))  Blood Culture (routine x 2)     Status: None (Preliminary result)   Collection Time: 06/14/15  9:10 AM  Result Value Ref Range Status   Specimen Description BLOOD LEFT ANTECUBITAL  Final   Special Requests BOTTLES DRAWN AEROBIC AND ANAEROBIC 5CC  Final   Culture NO GROWTH 4 DAYS  Final   Report Status PENDING  Incomplete  Blood Culture (routine x 2)     Status: None (Preliminary result)   Collection Time: 06/14/15  9:25 AM  Result Value Ref Range Status   Specimen Description BLOOD  LEFT ANTECUBITAL  Final   Special Requests IN PEDIATRIC BOTTLE 5CC  Final   Culture NO GROWTH 4 DAYS  Final   Report Status PENDING  Incomplete  Anaerobic culture     Status: None (Preliminary result)   Collection Time: 06/14/15 11:57 AM  Result Value Ref Range Status   Specimen Description FLUID ABDOMEN A  Final   Special Requests PATIENT ON FOLLOWING ZOSYN  Final   Gram Stain   Final    RARE WBC PRESENT, PREDOMINANTLY PMN FEW GRAM POSITIVE COCCOBACILLUS    Culture   Final    NO ANAEROBES ISOLATED; CULTURE IN PROGRESS FOR 5 DAYS   Report Status PENDING  Incomplete  Body fluid culture     Status: None   Collection Time: 06/14/15  1:04 PM  Result Value Ref Range Status   Specimen Description FLUID ABDOMEN A  Final   Special Requests PATIENT ON FOLLOWING ZOSYN  Final   Gram Stain   Final    RARE WBC PRESENT, PREDOMINANTLY PMN FEW GRAM POSITIVE COCCOBACILLUS    Culture NO GROWTH 3 DAYS  Final   Report Status 06/18/2015 FINAL  Final  Urine culture     Status: None   Collection Time: 06/14/15  2:30 PM  Result Value Ref Range Status   Specimen Description URINE, CATHETERIZED  Final   Special Requests NONE  Final   Culture NO GROWTH 2 DAYS  Final   Report Status 06/16/2015 FINAL  Final  MRSA PCR Screening     Status: None   Collection Time: 06/14/15  8:05 PM  Result Value Ref Range Status   MRSA by PCR NEGATIVE NEGATIVE Final    Comment:        The GeneXpert MRSA Assay (FDA approved for NASAL specimens only), is one component of a comprehensive MRSA colonization surveillance program. It is not intended to diagnose MRSA infection nor to guide or monitor treatment for MRSA infections.       Studies/Results: No results found.  Medications:  Scheduled: . sodium chloride   Intravenous Once  . brimonidine  1 drop Both Eyes BID  . budesonide  0.25 mg Nebulization BID  . enoxaparin (LOVENOX) injection  40 mg Subcutaneous Q24H  . fluconazole (DIFLUCAN) IV  100 mg  Intravenous Q24H  . insulin aspart  0-9 Units Subcutaneous 6 times per day  . ipratropium-albuterol  3 mL Nebulization BID  . latanoprost  1 drop Both Eyes QHS  . LORazepam  0.5 mg Intravenous Once  . metoprolol  2.5  mg Intravenous 3 times per day  . pantoprazole (PROTONIX) IV  40 mg Intravenous Q12H  . piperacillin-tazobactam (ZOSYN)  IV  3.375 g Intravenous Q8H   Continuous: . dextrose 5 % and 0.45% NaCl 100 mL/hr at 06/18/15 1610   RUE:[id number], hydrALAZINE, Influenza vac split quadrivalent PF, ipratropium-albuterol, oxyCODONE-acetaminophen, phenol, phenol, pneumococcal 23 valent vaccine, sodium chloride, white petrolatum, zolpidem  Assessment/Plan:  Active Problems:   Perforated ulcer (HCC)   Acute respiratory failure (HCC)    Perforated gastric ulcer status post repair (Dr. Janee Morn on 11/12) General surgery is following and managing for the most part. Remains on Zosyn and Diflucan. Defer antibiotics to general surgery. Pathology report showed that the ulcer was benign.  Acute respiratory failure, underlying COPD Currently stable. Extubated on 11/13  Acute kidney injury; likely due to sepsis from #1, dehydration Improved. Now close to baseline.  History of COPD Currently stable, no wheezing. Was successfully extubated on 11/13. Incentive spirometry.  Sepsis secondary to peritonitis from perforated gastric ulcer Currently stable. On Zosyn. Influenza negative, urine culture negative, blood cultures negative.  Normocytic Anemia Hemoglobin was 6.7 on 11/14, received 2 units packed RBC. Hemoglobin remained stable.  Hypokalemia and hypomagnesemia Supplement potassium. Patient will be given magnesium sulfate as well..   DVT Prophylaxis: Lovenox    Code Status: full code  Family Communication: discussed with patient Disposition Plan: patient is improving. General surgery thinks that she may be ready for discharge tomorrow. Per physical therapy, she will need  short-term rehabilitation.    LOS: 5 days   [facility]  [facility] Pager [phone] 06/19/2015, 9:33 AM  If 7PM-7AM, please contact night-coverage at [email or web address], password [facility]

## 2015-06-19 NOTE — Progress Notes (Signed)
Patient ID: Colleen [name], female   DOB: [birth year], 64 y.o.   MRN: [id number] 5 Days Post-Op  Subjective: Pt feels well today.  Tolerating clear liquids and has had multiple BMs.  Pain well controlled.  Objective: Vital signs in last 24 hours: Temp:  [97.6 F (36.4 C)-98.6 F (37 C)] 97.9 F (36.6 C) (11/17 0446) Pulse Rate:  [85-110] 85 (11/17 0446) Resp:  [16-18] 16 (11/17 0446) BP: (142-164)/(75-94) 164/94 mmHg (11/17 0446) SpO2:  [99 %-100 %] 99 % (11/17 0446) Last BM Date: 06/18/15  Intake/Output from previous day: 11/16 0701 - 11/17 0700 In: 213086.5 [P.O.:240; I.V.:12227.9; IV Piggyback:95966.7] Out: 90 [Drains:90] Intake/Output this shift:    PE: Abd: soft, appropriately tender, +BS, ND, midline wound is clean and packed  Lab Results:   Recent Labs  06/18/15 0441 06/19/15 0607  WBC 12.8* 10.0  HGB 10.3* 8.9*  HCT 30.8* 26.0*  PLT 314 236   BMET  Recent Labs  06/18/15 0441 06/19/15 0607  NA 131* 130*  K 3.3* 3.6  CL 98* 102  CO2 23 21*  GLUCOSE 124* 96  BUN 14 14  CREATININE 1.29* 1.32*  CALCIUM 8.4* 8.3*   PT/INR No results for input(s): LABPROT, INR in the last 72 hours. CMP     Component Value Date/Time   NA 130* 06/19/2015 0607   K 3.6 06/19/2015 0607   CL 102 06/19/2015 0607   CO2 21* 06/19/2015 0607   GLUCOSE 96 06/19/2015 0607   BUN 14 06/19/2015 0607   CREATININE 1.32* 06/19/2015 0607   CALCIUM 8.3* 06/19/2015 0607   PROT 7.5 06/14/2015 0823   ALBUMIN 3.7 06/14/2015 0823   AST 27 06/14/2015 0823   ALT 13* 06/14/2015 0823   ALKPHOS 77 06/14/2015 0823   BILITOT 0.4 06/14/2015 0823   GFRNONAA 42* 06/19/2015 0607   GFRAA 49* 06/19/2015 0607   Lipase  No results found for: LIPASE     Studies/Results: No results found.  Anti-infectives: Anti-infectives    Start     Dose/Rate Route Frequency Ordered Stop   06/17/15 1600  piperacillin-tazobactam (ZOSYN) IVPB 3.375 g     3.375 g 12.5 mL/hr over 240 Minutes Intravenous  Every 8 hours 06/17/15 1413     06/15/15 0900  fluconazole (DIFLUCAN) IVPB 100 mg     100 mg 50 mL/hr over 60 Minutes Intravenous Every 24 hours 06/15/15 0755     06/14/15 1600  [MAR Hold]  piperacillin-tazobactam (ZOSYN) IVPB 3.375 g  Status:  Discontinued     (MAR Hold since 06/14/15 1040)   3.375 g 12.5 mL/hr over 240 Minutes Intravenous Every 8 hours 06/14/15 0907 06/14/15 1122   06/14/15 1400  piperacillin-tazobactam (ZOSYN) IVPB 2.25 g  Status:  Discontinued     2.25 g 100 mL/hr over 30 Minutes Intravenous 3 times per day 06/14/15 1122 06/17/15 1413   06/14/15 0900  piperacillin-tazobactam (ZOSYN) IVPB 3.375 g     3.375 g 100 mL/hr over 30 Minutes Intravenous  Once 06/14/15 0857 06/14/15 1039       Assessment/Plan   POD 5, s/p ex lap with repair of perforated gastric ulcer with graham patch---Dr. Janee Morn 06/14/15 -advance to fulls and then to soft tomorrow morning for breakfast -needs to mobilize, PT eval -cont pulm toileting -BID WD dressing changes to midline wound -cont JP drain for now, will likely DC prior to DC home FEN-fulls, oral pain meds ID-zosyn/diflucan D#5. will DC after today's doses.  ABL anemia-s/p blood tx, labs pending AKI-resolved  COPD - per medicine DVT proph-SCDs/Lovenox, likely DC tomorrow  LOS: 5 days    Colleen [name] E 06/19/2015, 9:24 AM Pager: [phone]

## 2015-06-19 NOTE — Clinical Social Work Note (Signed)
[facility] at time of discharge. [facility] updated.  Marcelline Deistmily Saathvik Every, LCSW [phone] Orthopedics: [phone] Surgical: [phone]

## 2015-06-20 LAB — BASIC METABOLIC PANEL
ANION GAP: 8 (ref 5–15)
BUN: 13 mg/dL (ref 6–20)
CO2: 19 mmol/L — AB (ref 22–32)
Calcium: 8.4 mg/dL — ABNORMAL LOW (ref 8.9–10.3)
Chloride: 102 mmol/L (ref 101–111)
Creatinine, Ser: 1.2 mg/dL — ABNORMAL HIGH (ref 0.44–1.00)
GFR calc Af Amer: 55 mL/min — ABNORMAL LOW (ref >60)
GFR, EST NON AFRICAN AMERICAN: 47 mL/min — AB (ref >60)
GLUCOSE: 76 mg/dL (ref 65–99)
POTASSIUM: 4.2 mmol/L (ref 3.5–5.1)
Sodium: 129 mmol/L — ABNORMAL LOW (ref 135–145)

## 2015-06-20 LAB — CBC
HEMATOCRIT: 26.9 % — AB (ref 36.0–46.0)
HEMOGLOBIN: 9.1 g/dL — AB (ref 12.0–15.0)
MCH: 27 pg (ref 26.0–34.0)
MCHC: 33.8 g/dL (ref 30.0–36.0)
MCV: 79.8 fL (ref 78.0–100.0)
Platelets: 296 10*3/uL (ref 150–400)
RBC: 3.37 MIL/uL — ABNORMAL LOW (ref 3.87–5.11)
RDW: 15.3 % (ref 11.5–15.5)
WBC: 9.9 10*3/uL (ref 4.0–10.5)

## 2015-06-20 MED ORDER — OXYCODONE-ACETAMINOPHEN 5-325 MG PO TABS: 1.0000 | ORAL_TABLET | ORAL | Status: AC | PRN

## 2015-06-20 MED ORDER — FUROSEMIDE 20 MG PO TABS: 20.0000 mg | ORAL_TABLET | ORAL | Status: AC

## 2015-06-20 MED ORDER — SODIUM BICARBONATE 650 MG PO TABS
650.0000 mg | ORAL_TABLET | Freq: Two times a day (BID) | ORAL | Status: DC
  Administered 2015-06-20: 650 mg via ORAL
  Filled 2015-06-20: qty 1

## 2015-06-20 MED ORDER — IPRATROPIUM-ALBUTEROL 0.5-2.5 (3) MG/3ML IN SOLN: 3.0000 mL | RESPIRATORY_TRACT | Status: AC | PRN

## 2015-06-20 MED ORDER — OMEPRAZOLE 20 MG PO CPDR: 20.0000 mg | DELAYED_RELEASE_CAPSULE | Freq: Two times a day (BID) | ORAL | Status: AC

## 2015-06-20 MED ORDER — SODIUM BICARBONATE 650 MG PO TABS: 650.0000 mg | ORAL_TABLET | Freq: Two times a day (BID) | ORAL | Status: AC

## 2015-06-20 MED ORDER — FUROSEMIDE 20 MG PO TABS: 40.0000 mg | ORAL_TABLET | ORAL | Status: DC

## 2015-06-20 MED ORDER — CYCLOBENZAPRINE HCL 10 MG PO TABS
10.0000 mg | ORAL_TABLET | ORAL | Status: AC | PRN
Start: 2015-06-20 — End: ?

## 2015-06-20 MED ORDER — HYDROCOD POLST-CPM POLST ER 10-8 MG/5ML PO SUER: 5.0000 mL | ORAL | Status: DC | PRN

## 2015-06-20 MED ORDER — ACETAMINOPHEN 325 MG PO TABS: 325.0000 mg | ORAL_TABLET | Freq: Four times a day (QID) | ORAL | Status: DC | PRN

## 2015-06-20 NOTE — Clinical Social Work Note (Signed)
Clinical Social Worker facilitated patient discharge including contacting patient family and facility to confirm patient discharge plans.  Clinical information faxed to facility and family agreeable with plan.  CSW arranged ambulance transport via [facility] to [facility].  RN to call report prior to discharge [phone].  Clinical Social Worker will sign off for now as social work intervention is no longer needed. Please consult us again if new need arises.  Colleen [name], MSW, LCSWA [phone] 06/20/2015 12:40 PM

## 2015-06-20 NOTE — Discharge Summary (Signed)
[facility]  Physician Discharge Summary   Patient ID: Colleen [name] MRN: [id number] DOB/AGE: 1951-02-15 64 y.o.  Admit date: 06/14/2015 Discharge date: 06/20/2015  PCP: No primary care provider on file.  DISCHARGE DIAGNOSES:  Active Problems:   Perforated ulcer (HCC)   Acute respiratory failure (HCC)   RECOMMENDATIONS FOR OUTPATIENT FOLLOW UP: 1. Please check CBC and basic metabolic panel in 4 days 2. May give ensure or boost in between meals  DISCHARGE CONDITION: fair  Diet recommendation: Heart healthy  Filed Weights   06/15/15 0400 06/17/15 0419 06/18/15 0500  Weight: 46.9 kg (103 lb 6.3 oz) 50.531 kg (111 lb 6.4 oz) 51.075 kg (112 lb 9.6 oz)    INITIAL HISTORY: 64 year old African-American female with a past medical history of COPD presented with acute abdominal pain and dyspnea. Evaluation revealed pneumopericardium. She was found to have a perforated gastric ulcer when she underwent exploratory laparotomy.  Consultants:  Gen surgery Admitted by CCM  Procedures:  EXPLORATORY LAPAROTOMY GASTRIC BIOPSY REPAIR PERFORATED GASTRIC ULCER Intubation    HOSPITAL COURSE:   Perforated gastric ulcer status post repair (by Dr. Janee Morn on 11/12) Patient was found to have a perforated gastric ulcer. She underwent emergent surgery. She required intubation briefly. She was placed on Zosyn and Diflucan. She was taken off of antibiotics yesterday. Pathology report showed that the ulcer was benign. Her diet has been slowly advanced. The drain will be removed today per general surgery. Cleared by sternal surgery for discharge to SNF.   Acute respiratory failure, underlying COPD Currently stable. Extubated on 11/13  Acute kidney injury; likely due to sepsis from #1, dehydration Improved. Now close to baseline. Bicarbonate is noted to be low. She will be prescribed oral sodium bicarbonate. We will recommend repeating a basic metabolic panel in a few days. Dose of  Lasix has been reduced. She may benefit from continuing this lower dose as she has gained about 5 kg during this hospitalization. But her electrolytes and renal function should be monitored closely.  History of COPD Currently stable, no wheezing. Was successfully extubated on 11/13. Incentive spirometry will be beneficial.  Sepsis secondary to peritonitis from perforated gastric ulcer Sepsis has resolved. Cultures are all negative. Patient is currently off of antibiotics.   Normocytic Anemia Hemoglobin was 6.7 on 11/14, received 2 units packed RBC. Hemoglobin remains stable. Repeat CBC in a few days.  Hypokalemia and hypomagnesemia Potassium and magnesium was aggressively supplemented. We'll recommend rechecking electrolyte panel in a few days.   Overall, stable. Patient feels well. She has tolerated her diet without any difficulty. Okay for discharge to SNF. She eventually plans to relocate to [facility] [place] to stay closer to family. She will need to discuss timing of relocation with general surgery at the time of follow-up.    PERTINENT LABS:  The results of significant diagnostics from this hospitalization (including imaging, microbiology, ancillary and laboratory) are listed below for reference.    Microbiology: Recent Results (from the past 240 hour(s))  Blood Culture (routine x 2)     Status: None   Collection Time: 06/14/15  9:10 AM  Result Value Ref Range Status   Specimen Description BLOOD LEFT ANTECUBITAL  Final   Special Requests BOTTLES DRAWN AEROBIC AND ANAEROBIC 5CC  Final   Culture NO GROWTH 5 DAYS  Final   Report Status 06/19/2015 FINAL  Final  Blood Culture (routine x 2)     Status: None   Collection Time: 06/14/15  9:25 AM  Result Value Ref  Range Status   Specimen Description BLOOD LEFT ANTECUBITAL  Final   Special Requests IN PEDIATRIC BOTTLE 5CC  Final   Culture NO GROWTH 5 DAYS  Final   Report Status 06/19/2015 FINAL  Final  Anaerobic culture     Status: None    Collection Time: 06/14/15 11:57 AM  Result Value Ref Range Status   Specimen Description FLUID ABDOMEN A  Final   Special Requests PATIENT ON FOLLOWING ZOSYN  Final   Gram Stain   Final    RARE WBC PRESENT, PREDOMINANTLY PMN FEW GRAM POSITIVE COCCOBACILLUS GRAM STAIN REVIEWED-AGREE WITH RESULT MTV    Culture   Final    NO ANAEROBES ISOLATED ORGANISM SEEN ON GRAM STAIN WAS NOT RECOVERED IN CULTURE    Report Status 06/19/2015 FINAL  Final  Body fluid culture     Status: None   Collection Time: 06/14/15  1:04 PM  Result Value Ref Range Status   Specimen Description FLUID ABDOMEN A  Final   Special Requests PATIENT ON FOLLOWING ZOSYN  Final   Gram Stain   Final    RARE WBC PRESENT, PREDOMINANTLY PMN FEW GRAM POSITIVE COCCOBACILLUS    Culture NO GROWTH 3 DAYS  Final   Report Status 06/18/2015 FINAL  Final  Urine culture     Status: None   Collection Time: 06/14/15  2:30 PM  Result Value Ref Range Status   Specimen Description URINE, CATHETERIZED  Final   Special Requests NONE  Final   Culture NO GROWTH 2 DAYS  Final   Report Status 06/16/2015 FINAL  Final  MRSA PCR Screening     Status: None   Collection Time: 06/14/15  8:05 PM  Result Value Ref Range Status   MRSA by PCR NEGATIVE NEGATIVE Final    Comment:        The GeneXpert MRSA Assay (FDA approved for NASAL specimens only), is one component of a comprehensive MRSA colonization surveillance program. It is not intended to diagnose MRSA infection nor to guide or monitor treatment for MRSA infections.      Labs: Basic Metabolic Panel:  Recent Labs Lab 06/16/15 0437 06/17/15 0830 06/18/15 0441 06/19/15 0607 06/20/15 0510  NA 138 136 131* 130* 129*  K 3.6 3.3* 3.3* 3.6 4.2  CL 101 100* 98* 102 102  CO2 26 24 23  21* 19*  GLUCOSE 98 118* 124* 96 76  BUN 47* 22* 14 14 13   CREATININE 2.27* 1.50* 1.29* 1.32* 1.20*  CALCIUM 6.8* 8.5* 8.4* 8.3* 8.4*  MG 1.2* 1.4* 1.1* 1.6*  --   PHOS 5.6* 4.2 3.3  --   --     Liver Function Tests:  Recent Labs Lab 06/14/15 0823  AST 27  ALT 13*  ALKPHOS 77  BILITOT 0.4  PROT 7.5  ALBUMIN 3.7   CBC:  Recent Labs Lab 06/14/15 0823  06/16/15 0437 06/17/15 0830 06/18/15 0441 06/19/15 0607 06/20/15 0510  WBC 2.7*  < > 10.9* 12.3* 12.8* 10.0 9.9  NEUTROABS 1.8  --   --   --   --   --   --   HGB 12.4  < > 6.7* 9.6* 10.3* 8.9* 9.1*  HCT 37.8  < > 20.2* 27.9* 30.8* 26.0* 26.9*  MCV 85.3  < > 82.4 80.6 80.0 80.2 79.8  PLT 544*  < > 338 301 314 236 296  < > = values in this interval not displayed. Cardiac Enzymes:  Recent Labs Lab 06/14/15 0823  TROPONINI <0.03  BNP: BNP (last 3 results)  Recent Labs  06/14/15 0823  BNP 29.8     CBG:  Recent Labs Lab 06/18/15 1727 06/18/15 2016 06/18/15 2329 06/19/15 0442 06/19/15 0736  GLUCAP 83 93 90 100* 105*     IMAGING STUDIES Dg Chest 2 View  06/14/2015  CLINICAL DATA:  Short of breath. Diagnosed with pneumonia 2 weeks ago. History of COPD and hypertension. EXAM: CHEST  2 VIEW COMPARISON:  06/10/2015 FINDINGS: There is pneumoperitoneum below each hemidiaphragm, new from the prior study. Lungs are clear.  No pleural effusion or pneumothorax. Normal heart, mediastinum hila. Partly imaged right shoulder prosthesis appears well aligned and stable. IMPRESSION: 1. Significant pneumoperitoneum, new since the prior chest radiographs. 2. No acute cardiopulmonary disease. Electronically Signed   By: Amie Portland M.D.   On: 06/14/2015 08:47   Dg Chest Port 1 View  06/16/2015  CLINICAL DATA:  COPD.  Postoperative. EXAM: PORTABLE CHEST 1 VIEW COMPARISON:  06/14/2015 chest radiograph. FINDINGS: Surgical hardware overlies the lower cervical spine. Right internal jugular central venous catheter terminates in the lower third of the superior vena cava. Enteric tube enters the stomach, with the tip not seen on this image. Interval extubation. Stable cardiomediastinal silhouette with normal heart size. No  pneumothorax. No pleural effusion. Clear lungs, with no focal lung consolidation and no pulmonary edema. Right proximal humeral prosthesis. IMPRESSION: Well-positioned enteric tube and right IJ central venous catheter. No active cardiopulmonary disease. Electronically Signed   By: Delbert Phenix M.D.   On: 06/16/2015 09:58   Dg Chest Port 1 View  06/14/2015  CLINICAL DATA:  Status post repositioning of endotracheal tube today. EXAM: PORTABLE CHEST 1 VIEW COMPARISON:  Single view of the chest earlier this same day. FINDINGS: Endotracheal tube is in place with the tip 2.0 cm above the carina. Right IJ catheter and OG tube are again seen. The lungs are clear. No pneumothorax or pleural effusion. Heart size normal. IMPRESSION: Tip of endotracheal tube projects 2 cm above the carina. No other change. Electronically Signed   By: Drusilla Kanner M.D.   On: 06/14/2015 18:49   Dg Chest Port 1 View  06/14/2015  CLINICAL DATA:  Acute respiratory failure, intubated EXAM: PORTABLE CHEST 1 VIEW COMPARISON:  06/14/2015 FINDINGS: Endotracheal tube at the thoracic inlet, 10 cm above the carina. Lungs are clear.  No pleural effusion or pneumothorax. The heart is normal in size. Right IJ venous catheter terminates at the cavoatrial junction. Right shoulder arthroplasty. Pneumoperitoneum is less conspicuous on the current study. IMPRESSION: Endotracheal tube at the thoracic inlet, 10 cm above the carina. Right IJ venous catheter terminates at the cavoatrial junction. Electronically Signed   By: Charline Bills M.D.   On: 06/14/2015 15:44    DISCHARGE EXAMINATION: Filed Vitals:   06/19/15 1405 06/19/15 2001 06/19/15 2100 06/20/15 0517  BP: 131/77  169/88 168/87  Pulse: 88  100 100  Temp: 97.8 F (36.6 C)  97.7 F (36.5 C) 98.2 F (36.8 C)  TempSrc: Oral  Oral Oral  Resp: 18  19 19   Height:      Weight:      SpO2: 100% 100% 100% 100%   General appearance: alert, cooperative, appears stated age and no  distress Resp: Diminished air entry at the bases. No crackles or wheezing. Cardio: regular rate and rhythm, S1, S2 normal, no murmur, click, rub or gallop GI: Soft, mildly tender. Covered with dressing. Extremities: extremities normal, atraumatic, no cyanosis or edema  DISPOSITION: SNF  Discharge Instructions    Call MD for:  difficulty breathing, headache or visual disturbances    Complete by:  As directed      Call MD for:  extreme fatigue    Complete by:  As directed      Call MD for:  persistant dizziness or light-headedness    Complete by:  As directed      Call MD for:  persistant nausea and vomiting    Complete by:  As directed      Call MD for:  severe uncontrolled pain    Complete by:  As directed      Call MD for:  temperature >100.4    Complete by:  As directed      Diet - low sodium heart healthy    Complete by:  As directed      Discharge instructions    Complete by:  As directed   Please also follow up with your primary care doctor for your other medical issues. CBC and Basic metabolic panel in 4 days.  You were cared for by a hospitalist during your hospital stay. If you have any questions about your discharge medications or the care you received while you were in the hospital after you are discharged, you can call the unit and asked to speak with the hospitalist on call if the hospitalist that took care of you is not available. Once you are discharged, your primary care physician will handle any further medical issues. Please note that NO REFILLS for any discharge medications will be authorized once you are discharged, as it is imperative that you return to your primary care physician (or establish a relationship with a primary care physician if you do not have one) for your aftercare needs so that they can reassess your need for medications and monitor your lab values. If you do not have a primary care physician, you can call [phone] for a physician referral.      Increase activity slowly    Complete by:  As directed            ALLERGIES:  Allergies  Allergen Reactions  . Codeine Itching           Current Discharge Medication List    START taking these medications   Details  acetaminophen (TYLENOL) 325 MG tablet Take 1-2 tablets (325-650 mg total) by mouth every 6 (six) hours as needed for mild pain, fever or headache.    ipratropium-albuterol (DUONEB) 0.5-2.5 (3) MG/3ML SOLN Take 3 mLs by nebulization every 4 (four) hours as needed. Qty: 360 mL    omeprazole (PRILOSEC) 20 MG capsule Take 1 capsule (20 mg total) by mouth 2 (two) times daily.    oxyCODONE-acetaminophen (PERCOCET/ROXICET) 5-325 MG tablet Take 1-2 tablets by mouth every 4 (four) hours as needed for moderate pain or severe pain. Qty: 30 tablet, Refills: 0    sodium bicarbonate 650 MG tablet Take 1 tablet (650 mg total) by mouth 2 (two) times daily.      CONTINUE these medications which have CHANGED   Details  chlorpheniramine-HYDROcodone (TUSSIONEX) 10-8 MG/5ML SUER Take 5 mLs by mouth as needed for cough. 1 teaspoon as needed for cough Qty: 140 mL, Refills: 0    cyclobenzaprine (FLEXERIL) 10 MG tablet Take 1 tablet (10 mg total) by mouth as needed for muscle spasms. Moderate pain Qty: 30 tablet, Refills: 0    furosemide (LASIX) 20 MG tablet Take 1 tablet (20 mg total) by mouth every other  day. Qty: 30 tablet      CONTINUE these medications which have NOT CHANGED   Details  ADVAIR DISKUS 250-50 MCG/DOSE AEPB Inhale 2 puffs into the lungs 2 (two) times daily as needed. Refills: 99    ALPHAGAN P 0.1 % SOLN Place 1 drop into both eyes 2 (two) times daily. Refills: 2    BYSTOLIC 5 MG tablet Take 5 mg by mouth daily. Refills: 1    latanoprost (XALATAN) 0.005 % ophthalmic solution Place 1 drop into both eyes. 1 drop in both eyes once daily at bed time Refills: 4    PROAIR HFA 108 (90 BASE) MCG/ACT inhaler Inhale 1-2 puffs into the lungs every 4 (four) hours as  needed. Refills: 4    sertraline (ZOLOFT) 100 MG tablet Take 100 mg by mouth daily. Refills: 4    zolpidem (AMBIEN) 10 MG tablet Take 1 tablet by mouth at bedtime as needed. Refills: 0      STOP taking these medications     clarithromycin (BIAXIN XL) 500 MG 24 hr tablet      fluconazole (DIFLUCAN) 200 MG tablet      levofloxacin (LEVAQUIN) 500 MG tablet      meloxicam (MOBIC) 15 MG tablet      metroNIDAZOLE (METROCREAM) 0.75 % cream        Follow-up Information    Follow up with Liz Malady, MD On 07/16/2015.   Specialty:  General Surgery   Why:  11:50am, arrive no later than 11:20am for paperwork   Contact information:   [address] [place] [address] [phone]       TOTAL DISCHARGE TIME: 35 minutes  [facility]  [facility] Pager [phone]  06/20/2015, 11:25 AM

## 2015-06-20 NOTE — Clinical Social Work Placement (Signed)
CLINICAL SOCIAL WORK PLACEMENT  NOTE  Date:  06/20/2015  Patient Details  Name: Colleen [name] Reller MRN: [id number] Date of Birth: 06-08-1951  Clinical Social Work is seeking post-discharge placement for this patient at the Skilled  Nursing Facility level of care (*CSW will initial, date and re-position this form in  chart as items are completed):  Yes   Patient/family provided with [facility] Clinical Social Work Department's list of facilities offering this level of care within the geographic area requested by the patient (or if unable, by the patient's family).  Yes   Patient/family informed of their freedom to choose among providers that offer the needed level of care, that participate in Medicare, Medicaid or managed care program needed by the patient, have an available bed and are willing to accept the patient.  Yes   Patient/family informed of [facility]'s ownership interest in [facility] and [facility], as well as of the fact that they are under no obligation to receive care at these facilities.  PASRR submitted to EDS on 06/18/15     PASRR number received on 06/18/15     Existing PASRR number confirmed on       FL2 transmitted to all facilities in geographic area requested by pt/family on 06/18/15     FL2 transmitted to all facilities within larger geographic area on       Patient informed that his/her managed care company has contracts with or will negotiate with certain facilities, including the following:        Yes   Patient/family informed of bed offers received.  Patient chooses bed at  [facility] )     Physician recommends and patient chooses bed at      Patient to be transferred to  [facility] ) on 06/20/15.  Patient to be transferred to facility by  [facility] )     Patient family notified on 06/20/15 of transfer.  Name of family member notified:   (Pt's nephew, Yujik via telephone )     PHYSICIAN Please sign FL2     Additional Comment:     _______________________________________________ Derenda Fennel, MSW, LCSWA [phone] 06/20/2015 11:03 AM

## 2015-06-20 NOTE — Discharge Instructions (Signed)
[facility]      [facility] °[phone] ° °OPEN ABDOMINAL SURGERY: POST OP INSTRUCTIONS ° °Always review your discharge instruction sheet given to you by the facility where your surgery was performed. ° °IF YOU HAVE DISABILITY OR FAMILY LEAVE FORMS, YOU MUST BRING THEM TO THE OFFICE FOR PROCESSING.  PLEASE DO NOT GIVE THEM TO YOUR DOCTOR. ° °1. A prescription for pain medication may be given to you upon discharge.  Take your pain medication as prescribed, if needed.  If narcotic pain medicine is not needed, then you may take acetaminophen (Tylenol) or ibuprofen (Advil) as needed. °2. Take your usually prescribed medications unless otherwise directed. °3. If you need a refill on your pain medication, please contact your pharmacy. They will contact our office to request authorization.  Prescriptions will not be filled after 5pm or on week-ends. °4. You should follow a light diet the first few days after arrival home, such as soup and crackers, pudding, etc.unless your doctor has advised otherwise. A high-fiber, low fat diet can be resumed as tolerated.   Be sure to include lots of fluids daily. Most patients will experience some swelling and bruising on the chest and neck area.  Ice packs will help.  Swelling and bruising can take several days to resolve °5. Most patients will experience some swelling and bruising in the area of the incision. Ice pack will help. Swelling and bruising can take several days to resolve..  °6. It is common to experience some constipation if taking pain medication after surgery.  Increasing fluid intake and taking a stool softener will usually help or prevent this problem from occurring.  A mild laxative (Milk of Magnesia or Miralax) should be taken according to package directions if there are no bowel movements after 48 hours. °7.  You may have steri-strips (small skin tapes) in place directly over the incision.  These strips should be left on the skin for 7-10 days.  If your  surgeon used skin glue on the incision, you may shower in 24 hours.  The glue will flake off over the next 2-3 weeks.  Any sutures or staples will be removed at the office during your follow-up visit. You may find that a light gauze bandage over your incision may keep your staples from being rubbed or pulled. You may shower and replace the bandage daily. °8. ACTIVITIES:  You may resume regular (light) daily activities beginning the next day--such as daily self-care, walking, climbing stairs--gradually increasing activities as tolerated.  You may have sexual intercourse when it is comfortable.  Refrain from any heavy lifting or straining until approved by your doctor. °a. You may drive when you no longer are taking prescription pain medication, you can comfortably wear a seatbelt, and you can safely maneuver your car and apply brakes °b. Return to Work: ___________________________________ °9. You should see your doctor in the office for a follow-up appointment approximately two weeks after your surgery.  Make sure that you call for this appointment within a day or two after you arrive home to insure a convenient appointment time. °OTHER INSTRUCTIONS:  °_____________________________________________________________ °_____________________________________________________________ ° °WHEN TO CALL YOUR DOCTOR: °1. Fever over 101.0 °2. Inability to urinate °3. Nausea and/or vomiting °4. Extreme swelling or bruising °5. Continued bleeding from incision. °6. Increased pain, redness, or drainage from the incision. °7. Difficulty swallowing or breathing °8. Muscle cramping or spasms. °9. Numbness or tingling in hands or feet or around lips. ° °The clinic staff is available to   answer your questions during regular business hours.  Please dont hesitate to call and ask to speak to one of the nurses if you have concerns.  For further questions, please visit [email or web address]  Dressing Change, twice a day to midline  wound A dressing is a material placed over wounds. It keeps the wound clean, dry, and protected from further injury. This provides an environment that favors wound healing.  BEFORE YOU BEGIN  Get your supplies together. Things you may need include:  Saline solution.  Flexible gauze dressing.  Medicated cream.  Tape.  Gloves.  Abdominal dressing pads.  Gauze squares.  Plastic bags.  Take pain medicine 30 minutes before the dressing change if you need it.  Take a shower before you do the first dressing change of the day. Use plastic wrap or a plastic bag to prevent the dressing from getting wet. REMOVING YOUR OLD DRESSING   Wash your hands with soap and water. Dry your hands with a clean towel.  Put on your gloves.  Remove any tape.  Carefully remove the old dressing. If the dressing sticks, you may dampen it with warm water to loosen it, or follow your caregiver's specific directions.  Remove any gauze or packing tape that is in your wound.  Take off your gloves.  Put the gloves, tape, gauze, or any packing tape into a plastic bag. CHANGING YOUR DRESSING  Open the supplies.  Take the cap off the saline solution.  Open the gauze package so that the gauze remains on the inside of the package.  Put on your gloves.  Clean your wound as told by your caregiver.  If you have been told to keep your wound dry, follow those instructions.  Your caregiver may tell you to do one or more of the following:  Pick up the gauze. Pour the saline solution over the gauze. Squeeze out the extra saline solution.  Put medicated cream or other medicine on your wound if you have been told to do so.  Put the solution soaked gauze only in your wound, not on the skin around it.  Pack your wound loosely or as told by your caregiver.  Put dry gauze on your wound.  Put abdominal dressing pads over the dry gauze if your wet gauze soaks through.  Tape the abdominal dressing pads in  place so they will not fall off. Do not wrap the tape completely around the affected part (arm, leg, abdomen).  Wrap the dressing pads with a flexible gauze dressing to secure it in place.  Take off your gloves. Put them in the plastic bag with the old dressing. Tie the bag shut and throw it away.  Keep the dressing clean and dry until your next dressing change.  Wash your hands. SEEK MEDICAL CARE IF:  Your skin around the wound looks red.  Your wound feels more tender or sore.  You see pus in the wound.  Your wound smells bad.  You have a fever.  Your skin around the wound has a rash that itches and burns.  You see black or yellow skin in your wound that was not there before.  You feel nauseous, throw up, and feel very tired.   This information is not intended to replace advice given to you by your health care provider. Make sure you discuss any questions you have with your health care provider.   Document Released: 08/26/2004 Document Revised: 10/11/2011 Document Reviewed: 05/31/2011 Elsevier Interactive Patient Education [employer]2016 [employer]

## 2015-06-20 NOTE — Progress Notes (Signed)
Patient ID: Colleen [name], female   DOB: 1951-07-03, 64 y.o.   MRN: [id number] 6 Days Post-Op  Subjective: Pt feels well this morning.  Tolerating soft diet.    Objective: Vital signs in last 24 hours: Temp:  [97.7 F (36.5 C)-98.2 F (36.8 C)] 98.2 F (36.8 C) (11/18 0517) Pulse Rate:  [88-100] 100 (11/18 0517) Resp:  [18-19] 19 (11/18 0517) BP: (131-169)/(77-88) 168/87 mmHg (11/18 0517) SpO2:  [100 %] 100 % (11/18 0517) Last BM Date: 06/19/15  Intake/Output from previous day: 11/17 0701 - 11/18 0700 In: 2847.5 [P.O.:1000; I.V.:1397.5; IV Piggyback:450] Out: 70 [Drains:70] Intake/Output this shift: Total I/O In: 120 [P.O.:120] Out: -   PE: Abd: soft, appropriately tender, JP drain is serous, midline wound is clean, +BS  Lab Results:   Recent Labs  06/19/15 0607 06/20/15 0510  WBC 10.0 9.9  HGB 8.9* 9.1*  HCT 26.0* 26.9*  PLT 236 296   BMET  Recent Labs  06/19/15 0607 06/20/15 0510  NA 130* 129*  K 3.6 4.2  CL 102 102  CO2 21* 19*  GLUCOSE 96 76  BUN 14 13  CREATININE 1.32* 1.20*  CALCIUM 8.3* 8.4*   PT/INR No results for input(s): LABPROT, INR in the last 72 hours. CMP     Component Value Date/Time   NA 129* 06/20/2015 0510   K 4.2 06/20/2015 0510   CL 102 06/20/2015 0510   CO2 19* 06/20/2015 0510   GLUCOSE 76 06/20/2015 0510   BUN 13 06/20/2015 0510   CREATININE 1.20* 06/20/2015 0510   CALCIUM 8.4* 06/20/2015 0510   PROT 7.5 06/14/2015 0823   ALBUMIN 3.7 06/14/2015 0823   AST 27 06/14/2015 0823   ALT 13* 06/14/2015 0823   ALKPHOS 77 06/14/2015 0823   BILITOT 0.4 06/14/2015 0823   GFRNONAA 47* 06/20/2015 0510   GFRAA 55* 06/20/2015 0510   Lipase  No results found for: LIPASE     Studies/Results: No results found.  Anti-infectives: Anti-infectives    Start     Dose/Rate Route Frequency Ordered Stop   06/17/15 1600  piperacillin-tazobactam (ZOSYN) IVPB 3.375 g     3.375 g 12.5 mL/hr over 240 Minutes Intravenous Every 8 hours  06/17/15 1413 06/19/15 2040   06/15/15 0900  fluconazole (DIFLUCAN) IVPB 100 mg     100 mg 50 mL/hr over 60 Minutes Intravenous Every 24 hours 06/15/15 0755 06/19/15 1000   06/14/15 1600  [MAR Hold]  piperacillin-tazobactam (ZOSYN) IVPB 3.375 g  Status:  Discontinued     (MAR Hold since 06/14/15 1040)   3.375 g 12.5 mL/hr over 240 Minutes Intravenous Every 8 hours 06/14/15 0907 06/14/15 1122   06/14/15 1400  piperacillin-tazobactam (ZOSYN) IVPB 2.25 g  Status:  Discontinued     2.25 g 100 mL/hr over 30 Minutes Intravenous 3 times per day 06/14/15 1122 06/17/15 1413   06/14/15 0900  piperacillin-tazobactam (ZOSYN) IVPB 3.375 g     3.375 g 100 mL/hr over 30 Minutes Intravenous  Once 06/14/15 0857 06/14/15 1039       Assessment/Plan   POD 6, s/p ex lap with repair of perforated gastric ulcer with graham patch---Dr. Janee Morn 06/14/15 -tolerating solid diet. -stable for DC to SNF today -BID WD dressing changes to midline wound -Dc JP drain FEN-soft, oral pain meds ID-zosyn/diflucan D/C yesterday ABL anemia-stable  AKI-resolved  COPD - per medicine DVT proph-SCDs/Lovenox  Ok for DC to SNF.  Follow up has been arranged with Dr. Janee Morn and is in the DC instruction  section along with wound care instructions  LOS: 6 days    Gerrard Crystal E 06/20/2015, 10:14 AM Pager: [phone]

## 2015-06-20 NOTE — Progress Notes (Signed)
Pt discharge education and instructions completed. Pt IV and IJ removed; pt discharge to [facility]; report called off to PattenLindsay and nurse at the facility. Pt cleaned and changed into her own personal clothing. Pt to be transported to [facility] by [facility]. Pt picked up and transported off unit via stretcher with belongings to the side. Arabella MerlesP. Amo Shanyn Preisler RN.

## 2015-06-23 ENCOUNTER — Encounter: Payer: Self-pay | Admitting: Internal Medicine

## 2015-06-23 ENCOUNTER — Non-Acute Institutional Stay (SKILLED_NURSING_FACILITY): Payer: No Typology Code available for payment source | Admitting: Internal Medicine

## 2015-06-23 DIAGNOSIS — D500 Iron deficiency anemia secondary to blood loss (chronic): Secondary | ICD-10-CM | POA: Diagnosis not present

## 2015-06-23 DIAGNOSIS — J449 Chronic obstructive pulmonary disease, unspecified: Secondary | ICD-10-CM | POA: Diagnosis not present

## 2015-06-23 DIAGNOSIS — K275 Chronic or unspecified peptic ulcer, site unspecified, with perforation: Secondary | ICD-10-CM

## 2015-06-23 DIAGNOSIS — E878 Other disorders of electrolyte and fluid balance, not elsewhere classified: Secondary | ICD-10-CM

## 2015-06-23 NOTE — Progress Notes (Signed)
Patient ID: Colleen [name], female   DOB: 18-Sep-1950, 64 y.o.   MRN: [id number]    HISTORY AND PHYSICAL   DATE: 06/23/15  Location:  [facility]    Place of Service: SNF (31)   Extended Emergency Contact Information Primary Emergency Contact: Smith,Yujik Address: [address]          [place], [place] Home Phone: [phone] Relation: Nephew  Advanced Directive information  DNR  Chief Complaint  Patient presents with  . New Admit To SNF    HPI:  64 yo female seen today as a new admission into SNF following hospital stay for perforated gastric ulcer with acute blood loss anemia, acute respiratory failure, AKI, sepsis due to perforated gastric ulcer, hypokalemia and hypomagnesia. She had emergent exploratory lap with repair of perforation on 11/12th. She rec'd 2 units PRBCs for Hgb 6.7. She completed abx prior to d/c. Percocet Rx for pain. Magnesium improved from 1.2-->1.6. Cr improved 2.27-->1.2 at d/c. Potassium 4.2 at d/c.   She has no concerns today. Pain well controlled. She does have abdominal discomfort with movement. Appetite reduced but improving. No N/V. No f/c. She plans to move to [facility] once she heals. Her abdomin was left open. She has f/u appt with surgery already scheduled. Pain controlled on percocet and flexeril   COPD - stable on advair and prn HFA  Anemia - d/c H/H 9.1/26.9  GERD - stable on omeprazole  HTN - BP stable on lasix and bystolic  Insomnia - stable on ambien  Depression - mood stable on sertraline  Glaucoma - uses eye gtts alphagan  She takes sodium tabs for hyponatremia  Past Medical History  Diagnosis Date  . COPD (chronic obstructive pulmonary disease) (HCC)   . Hypertension   . Asthma   . Emphysema/COPD (HCC)   . Heart murmur   . Shortness of breath dyspnea   . Depression   . GERD (gastroesophageal reflux disease)   . Headache   . Arthritis     Past Surgical History    Procedure Laterality Date  . Abdominal hysterectomy    . Knee surgery      Bilateral  . Rotator cuff repair    . Laparotomy N/A 06/14/2015    Procedure: EXPLORATORY LAPAROTOMY AND REPAIR OF PERFORATED ULCER.;  Surgeon: Violeta Gelinas, MD;  Location: [facility] OR;  Service: General;  Laterality: N/A;  . Exploratory laparotomy  06/15/2015    perforated ulcer   . Back surgery      No care team member to display  Social History   Social History  . Marital Status: Divorced    Spouse Name: N/A  . Number of Children: N/A  . Years of Education: N/A   Occupational History  . Not on file.   Social History Main Topics  . Smoking status: Current Every Day Smoker -- 0.80 packs/day for 44 years    Types: Cigarettes  . Smokeless tobacco: Never Used  . Alcohol Use: Yes     Comment: occasional  . Drug Use: No  . Sexual Activity: Not on file   Other Topics Concern  . Not on file   Social History Narrative     reports that she has been smoking Cigarettes.  She has a 35.2 pack-year smoking history. She has never used smokeless tobacco. She reports that she drinks alcohol. She reports that she does not use illicit drugs.  No family history on file. No family  status information on file.    There is no immunization history for the selected administration types on file for this patient.  Allergies  Allergen Reactions  . Codeine Itching         Medications: Patient's Medications  New Prescriptions   No medications on file  Previous Medications   ACETAMINOPHEN (TYLENOL) 325 MG TABLET    Take 1-2 tablets (325-650 mg total) by mouth every 6 (six) hours as needed for mild pain, fever or headache.   ADVAIR DISKUS 250-50 MCG/DOSE AEPB    Inhale 2 puffs into the lungs 2 (two) times daily as needed.   ALPHAGAN P 0.1 % SOLN    Place 1 drop into both eyes 2 (two) times daily.   BYSTOLIC 5 MG TABLET    Take 5 mg by mouth daily.   CHLORPHENIRAMINE-HYDROCODONE (TUSSIONEX) 10-8 MG/5ML SUER    Take  5 mLs by mouth as needed for cough. 1 teaspoon as needed for cough   CYCLOBENZAPRINE (FLEXERIL) 10 MG TABLET    Take 1 tablet (10 mg total) by mouth as needed for muscle spasms. Moderate pain   FUROSEMIDE (LASIX) 20 MG TABLET    Take 1 tablet (20 mg total) by mouth every other day.   IPRATROPIUM-ALBUTEROL (DUONEB) 0.5-2.5 (3) MG/3ML SOLN    Take 3 mLs by nebulization every 4 (four) hours as needed.   LATANOPROST (XALATAN) 0.005 % OPHTHALMIC SOLUTION    Place 1 drop into both eyes. 1 drop in both eyes once daily at bed time   OMEPRAZOLE (PRILOSEC) 20 MG CAPSULE    Take 1 capsule (20 mg total) by mouth 2 (two) times daily.   OXYCODONE-ACETAMINOPHEN (PERCOCET/ROXICET) 5-325 MG TABLET    Take 1-2 tablets by mouth every 4 (four) hours as needed for moderate pain or severe pain.   PROAIR HFA 108 (90 BASE) MCG/ACT INHALER    Inhale 1-2 puffs into the lungs every 4 (four) hours as needed.   SERTRALINE (ZOLOFT) 100 MG TABLET    Take 100 mg by mouth daily.   SODIUM BICARBONATE 650 MG TABLET    Take 1 tablet (650 mg total) by mouth 2 (two) times daily.   ZOLPIDEM (AMBIEN) 10 MG TABLET    Take 1 tablet by mouth at bedtime as needed.  Modified Medications   No medications on file  Discontinued Medications   No medications on file    Review of Systems  Constitutional: Negative for fever, chills, diaphoresis, activity change, appetite change and fatigue.  HENT: Negative for ear pain and sore throat.   Eyes: Negative for visual disturbance.  Respiratory: Negative for cough, chest tightness and shortness of breath.   Cardiovascular: Negative for chest pain, palpitations and leg swelling.  Gastrointestinal: Positive for abdominal pain (with movement). Negative for nausea, vomiting, diarrhea, constipation and blood in stool.  Genitourinary: Negative for dysuria.  Musculoskeletal: Negative for arthralgias.  Neurological: Negative for dizziness, tremors, numbness and headaches.  Psychiatric/Behavioral:  Negative for sleep disturbance. The patient is not nervous/anxious.     Filed Vitals:   06/23/15 1614  BP: 117/60  Pulse: 80  Temp: 97.8 F (36.6 C)   There is no weight on file to calculate BMI.  Physical Exam  Constitutional: She is oriented to person, place, and time. She appears well-developed and well-nourished.  HENT:  Mouth/Throat: Oropharynx is clear and moist. No oropharyngeal exudate.  Eyes: Pupils are equal, round, and reactive to light. No scleral icterus.  Neck: Neck supple. Carotid bruit is not  present. No tracheal deviation present. No thyromegaly present.  Cardiovascular: Normal rate, regular rhythm, normal heart sounds and intact distal pulses.  Exam reveals no gallop and no friction rub.   No murmur heard. No LE edema b/l. no calf TTP.   Pulmonary/Chest: Effort normal. No stridor. No respiratory distress. She has wheezes (end expiratory with prolonged expiratory phase). She has no rales.  Abdominal: Soft. Bowel sounds are normal. She exhibits no distension and no mass. There is no hepatomegaly. There is tenderness (generalized). There is no rebound and no guarding.  Dressing c/d/i  Musculoskeletal: She exhibits edema.  Lymphadenopathy:    She has no cervical adenopathy.  Neurological: She is alert and oriented to person, place, and time.  Skin: Skin is warm and dry. No rash noted.  Psychiatric: She has a normal mood and affect. Her behavior is normal. Judgment and thought content normal.     Labs reviewed: Admission on 06/14/2015, Discharged on 06/20/2015  No results displayed because visit has over 200 results.      Dg Chest 2 View  06/14/2015  CLINICAL DATA:  Short of breath. Diagnosed with pneumonia 2 weeks ago. History of COPD and hypertension. EXAM: CHEST  2 VIEW COMPARISON:  06/10/2015 FINDINGS: There is pneumoperitoneum below each hemidiaphragm, new from the prior study. Lungs are clear.  No pleural effusion or pneumothorax. Normal heart, mediastinum  hila. Partly imaged right shoulder prosthesis appears well aligned and stable. IMPRESSION: 1. Significant pneumoperitoneum, new since the prior chest radiographs. 2. No acute cardiopulmonary disease. Electronically Signed   By: Amie Portland M.D.   On: 06/14/2015 08:47   Dg Chest Port 1 View  06/16/2015  CLINICAL DATA:  COPD.  Postoperative. EXAM: PORTABLE CHEST 1 VIEW COMPARISON:  06/14/2015 chest radiograph. FINDINGS: Surgical hardware overlies the lower cervical spine. Right internal jugular central venous catheter terminates in the lower third of the superior vena cava. Enteric tube enters the stomach, with the tip not seen on this image. Interval extubation. Stable cardiomediastinal silhouette with normal heart size. No pneumothorax. No pleural effusion. Clear lungs, with no focal lung consolidation and no pulmonary edema. Right proximal humeral prosthesis. IMPRESSION: Well-positioned enteric tube and right IJ central venous catheter. No active cardiopulmonary disease. Electronically Signed   By: Delbert Phenix M.D.   On: 06/16/2015 09:58   Dg Chest Port 1 View  06/14/2015  CLINICAL DATA:  Status post repositioning of endotracheal tube today. EXAM: PORTABLE CHEST 1 VIEW COMPARISON:  Single view of the chest earlier this same day. FINDINGS: Endotracheal tube is in place with the tip 2.0 cm above the carina. Right IJ catheter and OG tube are again seen. The lungs are clear. No pneumothorax or pleural effusion. Heart size normal. IMPRESSION: Tip of endotracheal tube projects 2 cm above the carina. No other change. Electronically Signed   By: Drusilla Kanner M.D.   On: 06/14/2015 18:49   Dg Chest Port 1 View  06/14/2015  CLINICAL DATA:  Acute respiratory failure, intubated EXAM: PORTABLE CHEST 1 VIEW COMPARISON:  06/14/2015 FINDINGS: Endotracheal tube at the thoracic inlet, 10 cm above the carina. Lungs are clear.  No pleural effusion or pneumothorax. The heart is normal in size. Right IJ venous catheter  terminates at the cavoatrial junction. Right shoulder arthroplasty. Pneumoperitoneum is less conspicuous on the current study. IMPRESSION: Endotracheal tube at the thoracic inlet, 10 cm above the carina. Right IJ venous catheter terminates at the cavoatrial junction. Electronically Signed   By: Roselie Awkward.D.  On: 06/14/2015 15:44     Assessment/Plan   ICD-9-CM ICD-10-CM   1. Perforated ulcer (HCC) 533.50 K27.5    gastric; s/p repair  2. Chronic obstructive pulmonary disease, unspecified COPD type (HCC) 496 J44.9   3. Electrolyte disturbance 276.9 E87.8    low K and Mg  4. Normocytic anemia due to blood loss 280.0 D50.0    s/p 2 units PRBCs    Check BMP, Mg and CBC  PT/OT as ordered  Mechanical soft diet and advance as tolerated  Wound care to abdominal wound  Cont current meds as ordered. Cont pain meds  GOAL: short term rehab and d/c home when medically appropriate. Communicated with pt and nursing.  Will follow  Kratos Ruscitti S. Ancil Linsey  [facility] [address] [place], [place] [address] [phone] Cell (Monday-Friday 8 AM - 5 PM) [phone] After 5 PM and follow prompts

## 2015-06-25 LAB — BASIC METABOLIC PANEL
BUN: 18 mg/dL (ref 4–21)
Creatinine: 1.6 mg/dL — AB (ref 0.5–1.1)
GLUCOSE: 69 mg/dL
Potassium: 4.8 mmol/L (ref 3.4–5.3)
Sodium: 135 mmol/L — AB (ref 137–147)

## 2015-06-25 LAB — CBC AND DIFFERENTIAL
HEMATOCRIT: 26 % — AB (ref 36–46)
HEMOGLOBIN: 8.2 g/dL — AB (ref 12.0–16.0)
PLATELETS: 472 10*3/uL — AB (ref 150–399)
WBC: 12.9 10*3/mL

## 2015-07-02 ENCOUNTER — Non-Acute Institutional Stay (SKILLED_NURSING_FACILITY): Payer: No Typology Code available for payment source | Admitting: Nurse Practitioner

## 2015-07-02 ENCOUNTER — Encounter: Payer: Self-pay | Admitting: Nurse Practitioner

## 2015-07-02 DIAGNOSIS — D500 Iron deficiency anemia secondary to blood loss (chronic): Secondary | ICD-10-CM

## 2015-07-02 DIAGNOSIS — IMO0001 Reserved for inherently not codable concepts without codable children: Secondary | ICD-10-CM

## 2015-07-02 DIAGNOSIS — E878 Other disorders of electrolyte and fluid balance, not elsewhere classified: Secondary | ICD-10-CM

## 2015-07-02 DIAGNOSIS — J449 Chronic obstructive pulmonary disease, unspecified: Secondary | ICD-10-CM

## 2015-07-02 DIAGNOSIS — K275 Chronic or unspecified peptic ulcer, site unspecified, with perforation: Secondary | ICD-10-CM | POA: Diagnosis not present

## 2015-07-02 DIAGNOSIS — I10 Essential (primary) hypertension: Secondary | ICD-10-CM | POA: Diagnosis not present

## 2015-07-02 DIAGNOSIS — T814XXA Infection following a procedure, initial encounter: Secondary | ICD-10-CM

## 2015-07-02 NOTE — Progress Notes (Signed)
Patient ID: Colleen [name], female   DOB: 1951-02-11, 64 y.o.   MRN: [id number]    Nursing Home Location:  [facility]   Place of Service: SNF (31)  PCP: No primary care provider on file.  Allergies  Allergen Reactions  . Codeine Itching         Chief Complaint  Patient presents with  . Discharge Note    Discharge from SNF     HPI:  Patient is a 64 y.o. female seen today at [facility] for discharge. Pt is at [facility] following hospital stay for perforated gastric ulcer with acute blood loss anemia, acute respiratory failure, AKI, sepsis due to perforated gastric ulcer, hypokalemia and hypomagnesia. She had emergent exploratory lap with repair of perforation on 11/12th. She rec'd 2 units PRBCs for Hgb 6.7. She completed abx prior to discharge from facility. Pt has been doing well and plan to move to [place] with family upon discharge. Her flight leaves today.  She has no concerns today. Pain well controlled. Appetite improving. No N/V or constipation. her abdomin was left open but healing. Increased drainage noted on exam. No fevers or chills.  Review of Systems:  Review of Systems  Constitutional: Negative for fever, chills, diaphoresis, activity change, appetite change and fatigue.  HENT: Negative for ear pain and sore throat.   Eyes: Negative for visual disturbance.  Respiratory: Negative for cough, chest tightness and shortness of breath.   Cardiovascular: Negative for chest pain, palpitations and leg swelling.  Gastrointestinal: Positive for abdominal pain (with movement). Negative for nausea, vomiting, diarrhea, constipation and blood in stool.  Genitourinary: Negative for dysuria.  Musculoskeletal: Negative for arthralgias.  Neurological: Negative for dizziness, tremors, numbness and headaches.  Psychiatric/Behavioral: Negative for sleep disturbance. The patient is not nervous/anxious.     Past Medical History  Diagnosis Date  . COPD  (chronic obstructive pulmonary disease) (HCC)   . Hypertension   . Asthma   . Emphysema/COPD (HCC)   . Heart murmur   . Shortness of breath dyspnea   . Depression   . GERD (gastroesophageal reflux disease)   . Headache   . Arthritis    Past Surgical History  Procedure Laterality Date  . Abdominal hysterectomy    . Knee surgery      Bilateral  . Rotator cuff repair    . Laparotomy N/A 06/14/2015    Procedure: EXPLORATORY LAPAROTOMY AND REPAIR OF PERFORATED ULCER.;  Surgeon: Violeta Gelinas, MD;  Location: [facility] OR;  Service: General;  Laterality: N/A;  . Exploratory laparotomy  06/15/2015    perforated ulcer   . Back surgery     Social History:   reports that she has been smoking Cigarettes.  She has a 35.2 pack-year smoking history. She has never used smokeless tobacco. She reports that she drinks alcohol. She reports that she does not use illicit drugs.  History reviewed. No pertinent family history.  Medications: Patient's Medications  New Prescriptions   No medications on file  Previous Medications   ACETAMINOPHEN (TYLENOL) 325 MG TABLET    Take 650 mg by mouth every 6 (six) hours as needed for mild pain, fever or headache.   ADVAIR DISKUS 250-50 MCG/DOSE AEPB    Inhale 2 puffs into the lungs 2 (two) times daily as needed.   ALPHAGAN P 0.1 % SOLN    Place 1 drop into both eyes 2 (two) times daily.   BYSTOLIC 5 MG TABLET    Take 5  mg by mouth daily.   CYCLOBENZAPRINE (FLEXERIL) 10 MG TABLET    Take 1 tablet (10 mg total) by mouth as needed for muscle spasms. Moderate pain   FUROSEMIDE (LASIX) 20 MG TABLET    Take 1 tablet (20 mg total) by mouth every other day.   IPRATROPIUM-ALBUTEROL (DUONEB) 0.5-2.5 (3) MG/3ML SOLN    Take 3 mLs by nebulization every 4 (four) hours as needed.   LATANOPROST (XALATAN) 0.005 % OPHTHALMIC SOLUTION    Place 1 drop into both eyes. 1 drop in both eyes once daily at bed time   MAGNESIUM OXIDE (MAG-OX) 400 MG TABLET    Take 400 mg by mouth daily.    OMEPRAZOLE (PRILOSEC) 20 MG CAPSULE    Take 1 capsule (20 mg total) by mouth 2 (two) times daily.   OXYCODONE-ACETAMINOPHEN (PERCOCET/ROXICET) 5-325 MG TABLET    Take 1-2 tablets by mouth every 4 (four) hours as needed for moderate pain or severe pain.   PROAIR HFA 108 (90 BASE) MCG/ACT INHALER    Inhale 1-2 puffs into the lungs every 4 (four) hours as needed.   SERTRALINE (ZOLOFT) 100 MG TABLET    Take 100 mg by mouth daily.   SODIUM BICARBONATE 650 MG TABLET    Take 1 tablet (650 mg total) by mouth 2 (two) times daily.   UNABLE TO FIND    Med Name:Med Pass 120 mL by mouth twice daily   ZOLPIDEM (AMBIEN) 10 MG TABLET    Take 1 tablet by mouth at bedtime as needed.  Modified Medications   No medications on file  Discontinued Medications   ACETAMINOPHEN (TYLENOL) 325 MG TABLET    Take 1-2 tablets (325-650 mg total) by mouth every 6 (six) hours as needed for mild pain, fever or headache.   CHLORPHENIRAMINE-HYDROCODONE (TUSSIONEX) 10-8 MG/5ML SUER    Take 5 mLs by mouth as needed for cough. 1 teaspoon as needed for cough     Physical Exam: Filed Vitals:   07/02/15 1048  BP: 133/89  Pulse: 98  Temp: 98.1 F (36.7 C)  TempSrc: Oral  Resp: 18  Height:  (1.676 m)  Weight: 115 lb (52.164 kg)    Physical Exam  Constitutional: She is oriented to person, place, and time. She appears well-developed and well-nourished. No distress.  HENT:  Head: Normocephalic and atraumatic.  Mouth/Throat: Oropharynx is clear and moist. No oropharyngeal exudate.  Eyes: Conjunctivae are normal. Pupils are equal, round, and reactive to light.  Neck: Normal range of motion. Neck supple.  Cardiovascular: Normal rate, regular rhythm and normal heart sounds.   Pulmonary/Chest: Effort normal. She has wheezes (in bilateral upper lobes).  Abdominal: Soft. Bowel sounds are normal. She exhibits no distension.  Midline abdominal wound with minimal yellow drainage, base pink, no erythema to surrounding tissues    Musculoskeletal: She exhibits no edema or tenderness.  Neurological: She is alert and oriented to person, place, and time.  Skin: Skin is warm and dry. She is not diaphoretic.  Psychiatric: She has a normal mood and affect.    Labs reviewed: Basic Metabolic Panel:  Recent Labs  40/98/11 0437 06/17/15 0830 06/18/15 0441 06/19/15 0607 06/20/15 0510 06/25/15  NA 138 136 131* 130* 129* 135*  K 3.6 3.3* 3.3* 3.6 4.2 4.8  CL 101 100* 98* 102 102  --   CO2 21* 19*  --   GLUCOSE 98 118* 124* 96 76  --   BUN 47* 22* 18  CREATININE 2.27* 1.50* 1.29* 1.32* 1.20* 1.6*  CALCIUM 6.8* 8.5* 8.4* 8.3* 8.4*  --   MG 1.2* 1.4* 1.1* 1.6*  --   --   PHOS 5.6* 4.2 3.3  --   --   --    Liver Function Tests:  Recent Labs  06/14/15 0823  AST 27  ALT 13*  ALKPHOS 77  BILITOT 0.4  PROT 7.5  ALBUMIN 3.7   No results for input(s): LIPASE, AMYLASE in the last 8760 hours. No results for input(s): AMMONIA in the last 8760 hours. CBC:  Recent Labs  06/14/15 0823  06/18/15 0441 06/19/15 0607 06/20/15 0510 06/25/15  WBC 2.7*  < > 12.8* 10.0 9.9 12.9  NEUTROABS 1.8  --   --   --   --   --   HGB 12.4  < > 10.3* 8.9* 9.1* 8.2*  HCT 37.8  < > 30.8* 26.0* 26.9* 26*  MCV 85.3  < > 80.0 80.2 79.8  --   PLT 544*  < > 314 236 296 472*  < > = values in this interval not displayed. TSH: No results for input(s): TSH in the last 8760 hours. A1C: No results found for: HGBA1C Lipid Panel:  Recent Labs  06/14/15 1340  TRIG 113    Assessment/Plan 1. Perforated ulcer (HCC) Patient was found to have a perforated gastric ulcer. She underwent emergent surgery. Benign ulcer. Tolerating diet with good BMs. Some abdominal pain around incision site. Pt [place] [facility] [facility] [place] . Stress the importance of proper follow up with pt and nephew who was at bedside and they understand.   2. Chronic obstructive pulmonary disease, unspecified COPD type  (HCC) COPD remains stable, with slight wheezing today however pt reports no worsening cough, congestion, sputum or shortness of breath.  -conts on advair and as needed albuterol   3. Normocytic anemia due to blood loss hgb at 8.2, no signs of bleeding or dark stools. Stress importance of getting this lab followed up in the next few days once she gets to [place]  4. Essential hypertension Stable, conts on bystolic   5. Wound infection after surgery, initial encounter drainage noted from abdominal wound, staff reports this has been stable and wound healing. However with elevated WBC and pt leaving to go to [place] with nefew will treat with doxycyline 100 mg twice daily for 7 days  6. Electrolyte disturbance Low potassium and Mg, potassium improved on recent lab -conts on mag oxide 400 mg TID   pt is stable for discharge, pt adamant about leaving today and has flight already scheduled. -unable to order home health due to [place] [facility] [place] today. No DME needed. Rx written.   will need to follow up with a physician within a few days and max of 1 week. Pt understands need to follow up with provider due to recent perforated ulcer and follow up lab work. Pt and nephew fully understands risk of leaving today. Pt and nephew also understand the risk  of worsening infection, unsafe drop in hgb which could result in need for hospitalization and possible death could occur without proper follow up.    Janene HarveyJessica K. Biagio BorgEubanks, AGNP  [facility] [phone](Monday-Friday 8 am - 5 pm) [phone] (after hours)

## 2015-07-10 ENCOUNTER — Inpatient Hospital Stay: Payer: PRIVATE HEALTH INSURANCE | Admitting: Adult Health

## 2017-01-14 IMAGING — CR DG CHEST 1V PORT
1 series · 1 of 1 positions shown · non-contrast
Comparison: 06/14/2015

CLINICAL DATA: Acute respiratory failure, intubated

EXAM:
PORTABLE CHEST 1 VIEW

[AP]
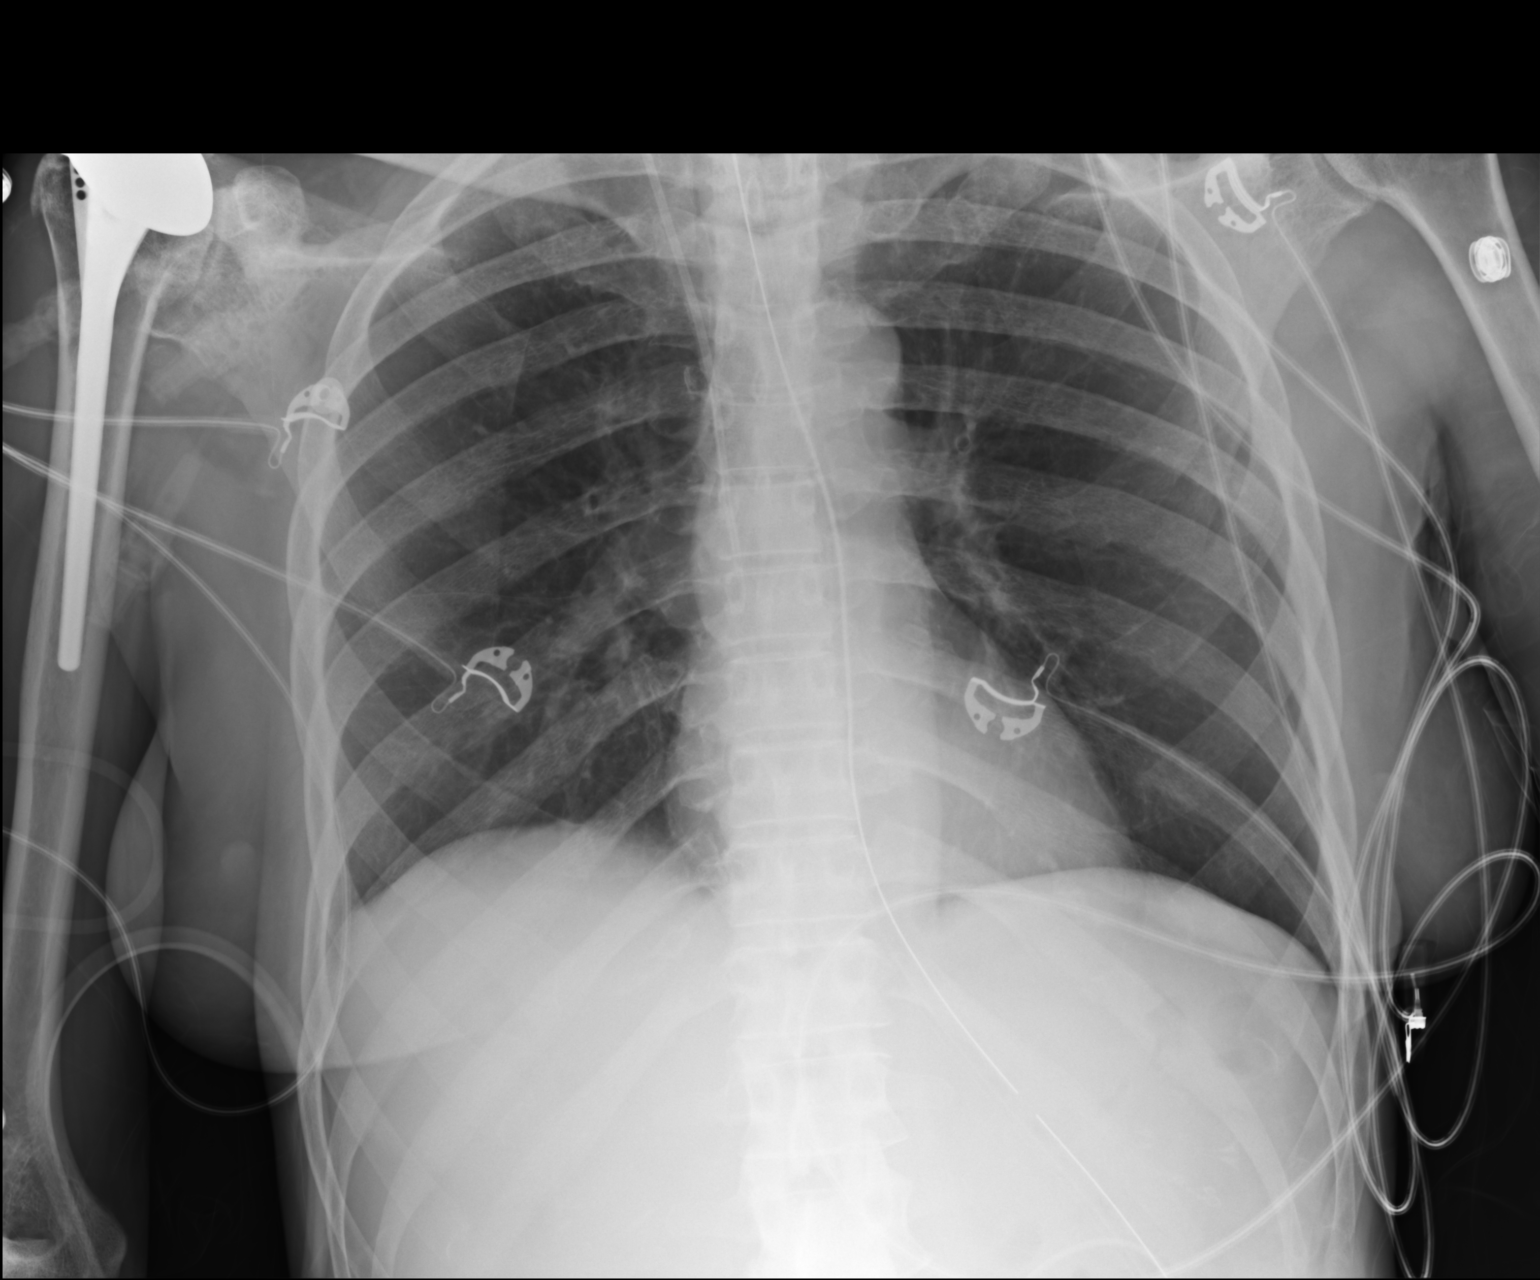

[1 of 1 positions shown; findings below may reference images not displayed]

FINDINGS: Endotracheal tube at the thoracic inlet, 10 cm above the carina.

Lungs are clear.  No pleural effusion or pneumothorax.

The heart is normal in size.

Right IJ venous catheter terminates at the cavoatrial junction.

Right shoulder arthroplasty.

Pneumoperitoneum is less conspicuous on the current study.
IMPRESSION: Endotracheal tube at the thoracic inlet, 10 cm above the carina.

Right IJ venous catheter terminates at the cavoatrial junction.

## 2017-01-14 IMAGING — CR DG CHEST 1V PORT
1 series · 1 of 1 positions shown · non-contrast
Comparison: Single view of the chest earlier this same day.

CLINICAL DATA: Status post repositioning of endotracheal tube
today.

EXAM:
PORTABLE CHEST 1 VIEW

[AP]
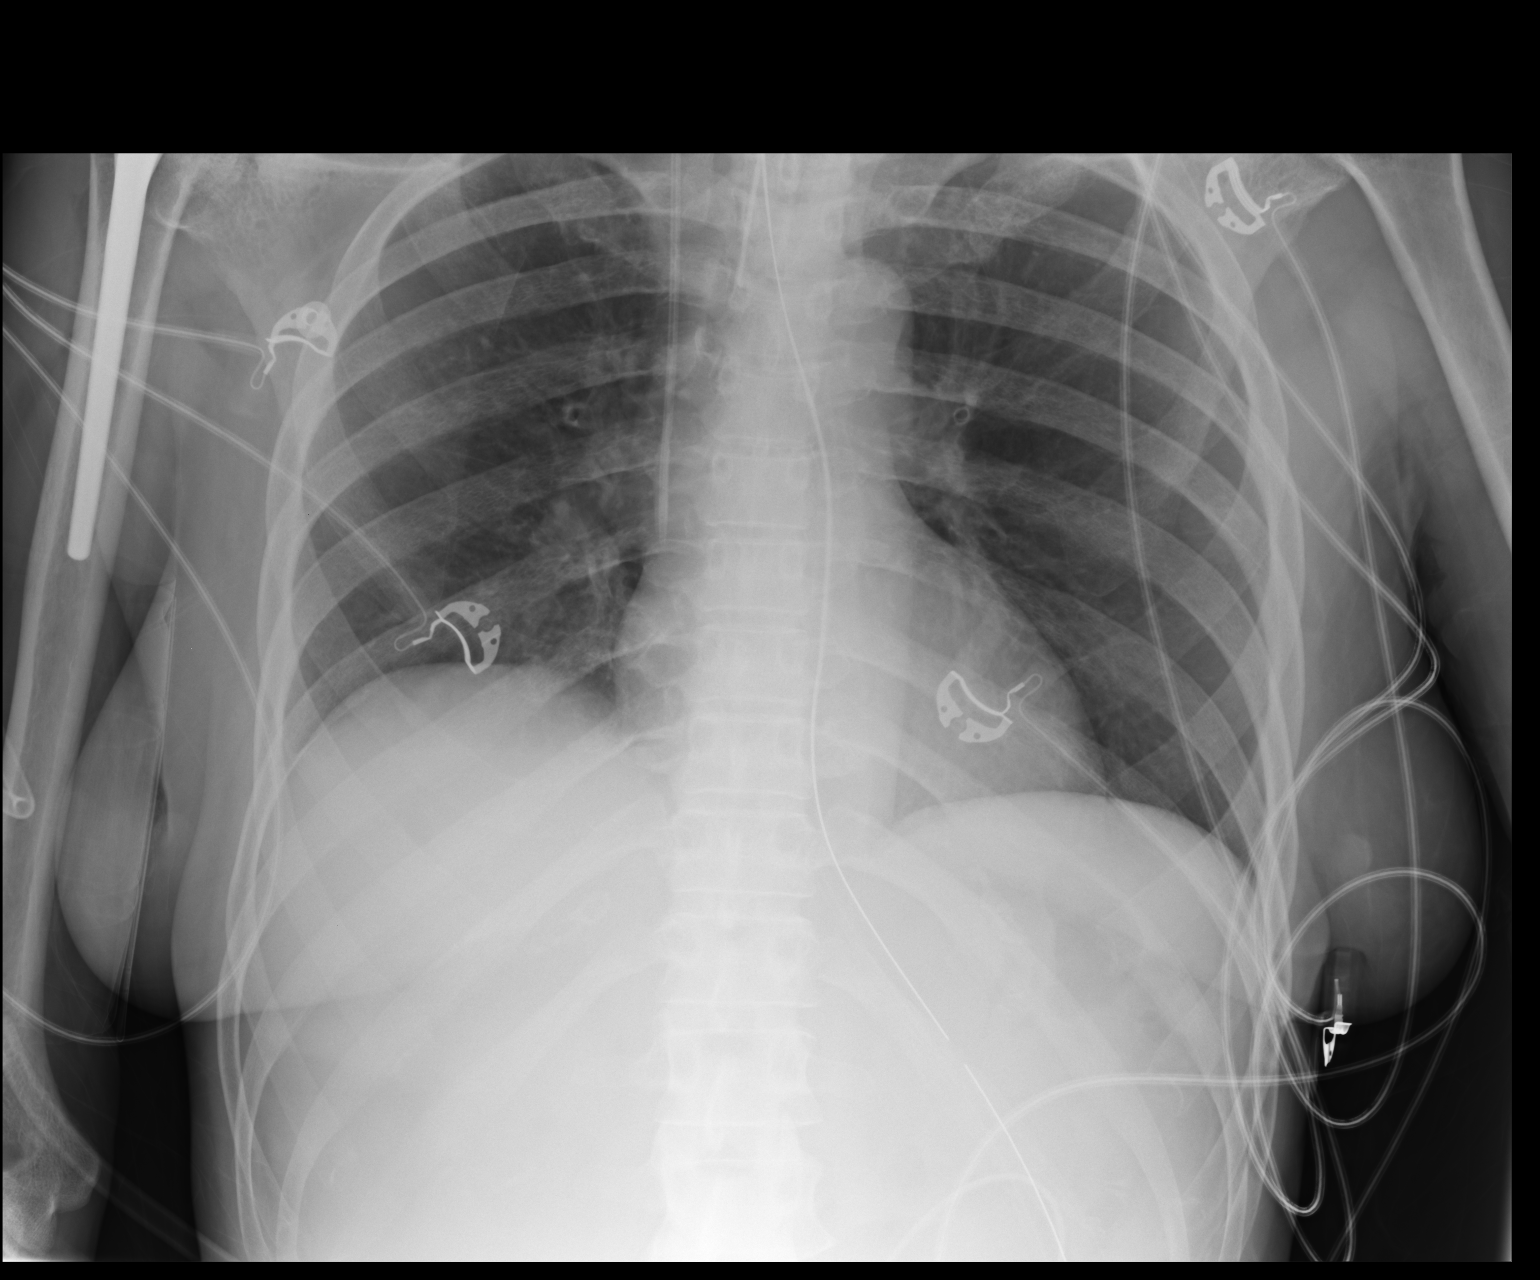

[1 of 1 positions shown; findings below may reference images not displayed]

FINDINGS: Endotracheal tube is in place with the tip 2.0 cm above the carina.
Right IJ catheter and OG tube are again seen. The lungs are clear.
No pneumothorax or pleural effusion. Heart size normal.
IMPRESSION: Tip of endotracheal tube projects 2 cm above the carina. No other
change.
